# Patient Record
Sex: Female | Born: 2000 | Race: Black or African American | Hispanic: No | Marital: Single | Smoking: Former smoker
Health system: Southern US, Community
[De-identification: ages and names within clinical notes are randomized; demographics above are authoritative.]

## PROBLEM LIST (undated history)

## (undated) DIAGNOSIS — Z789 Other specified health status: Secondary | ICD-10-CM

## (undated) HISTORY — PX: NO PAST SURGERIES: SHX2092

---

## 2021-03-06 ENCOUNTER — Emergency Department (HOSPITAL_COMMUNITY)
Admission: EM | Admit: 2021-03-06 | Discharge: 2021-03-07 | Disposition: A | Discharge status: Home / Self Care | Payer: BC Managed Care – PPO | Attending: Emergency Medicine | Admitting: Emergency Medicine

## 2021-03-06 ENCOUNTER — Encounter (HOSPITAL_COMMUNITY): Payer: Self-pay | Admitting: Emergency Medicine

## 2021-03-06 ENCOUNTER — Other Ambulatory Visit: Payer: Self-pay

## 2021-03-06 DIAGNOSIS — R1084 Generalized abdominal pain: Secondary | ICD-10-CM

## 2021-03-06 DIAGNOSIS — O219 Vomiting of pregnancy, unspecified: Secondary | ICD-10-CM | POA: Diagnosis not present

## 2021-03-06 DIAGNOSIS — R109 Unspecified abdominal pain: Secondary | ICD-10-CM | POA: Insufficient documentation

## 2021-03-06 DIAGNOSIS — Z3A09 9 weeks gestation of pregnancy: Secondary | ICD-10-CM | POA: Diagnosis not present

## 2021-03-06 DIAGNOSIS — Z3490 Encounter for supervision of normal pregnancy, unspecified, unspecified trimester: Secondary | ICD-10-CM

## 2021-03-06 DIAGNOSIS — R112 Nausea with vomiting, unspecified: Secondary | ICD-10-CM

## 2021-03-06 MED ORDER — ONDANSETRON 4 MG PO TBDP
4.0000 mg | ORAL_TABLET | Freq: Once | ORAL | Status: AC
  Administered 2021-03-06: 4 mg via ORAL
  Filled 2021-03-06: qty 1

## 2021-03-06 NOTE — ED Provider Notes (Signed)
Emergency Medicine Provider Triage Evaluation Note  Maureen Atkinson , a 20 y.o. female  was evaluated in triage.  Pt complains of nausea and NBNB emesis that began earlier today. Pt states she began having diffuse abdominal pain around 7 PM today. NO diarrhea. No fevers or chills. No previous abdominal surgeries.   Review of Systems  Positive: + abdominal pain, nausea, vomiting Negative: - fevers, chills  Physical Exam  BP 124/70   Pulse 98   Temp 98.8 F (37.1 C)   Resp 18   Ht 5\' 2"  (1.575 m)   Wt 79.4 kg   SpO2 100%   BMI 32.01 kg/m  Gen:   Awake, no distress   Resp:  Normal effort  MSK:   Moves extremities without difficulty  Other:  Diffuse abdominal TTP  Medical Decision Making  Medically screening exam initiated at 10:56 PM.  Appropriate orders placed.  Maureen Atkinson was informed that the remainder of the evaluation will be completed by another provider, this initial triage assessment does not replace that evaluation, and the importance of remaining in the ED until their evaluation is complete.     Harvest Dark, PA-C 03/06/21 2257    03/08/21, MD 03/07/21 1349

## 2021-03-06 NOTE — ED Triage Notes (Signed)
Patient presents with upper abdominal pain since 1500 today. She has had 3 episodes of vomiting since. She tried to eat after her first episode but was unable to keep the food down.

## 2021-03-07 ENCOUNTER — Emergency Department (HOSPITAL_COMMUNITY): Payer: BC Managed Care – PPO

## 2021-03-07 LAB — CBC
HCT: 40.9 % (ref 36.0–46.0)
Hemoglobin: 13.6 g/dL (ref 12.0–15.0)
MCH: 30.6 pg (ref 26.0–34.0)
MCHC: 33.3 g/dL (ref 30.0–36.0)
MCV: 92.1 fL (ref 80.0–100.0)
Platelets: 433 10*3/uL — ABNORMAL HIGH (ref 150–400)
RBC: 4.44 MIL/uL (ref 3.87–5.11)
RDW: 13.2 % (ref 11.5–15.5)
WBC: 9.6 10*3/uL (ref 4.0–10.5)
nRBC: 0 % (ref 0.0–0.2)

## 2021-03-07 LAB — URINALYSIS, ROUTINE W REFLEX MICROSCOPIC
Bilirubin Urine: NEGATIVE
Glucose, UA: NEGATIVE mg/dL
Hgb urine dipstick: NEGATIVE
Ketones, ur: 40 mg/dL — AB
Leukocytes,Ua: NEGATIVE
Nitrite: NEGATIVE
Specific Gravity, Urine: >1.03 — ABNORMAL HIGH (ref 1.005–1.030)
pH: 6 (ref 5.0–8.0)

## 2021-03-07 LAB — COMPREHENSIVE METABOLIC PANEL
ALT: 16 U/L (ref 0–44)
AST: 22 U/L (ref 15–41)
Albumin: 4.5 g/dL (ref 3.5–5.0)
Alkaline Phosphatase: 59 U/L (ref 38–126)
Anion gap: 10 (ref 5–15)
BUN: 11 mg/dL (ref 6–20)
CO2: 22 mmol/L (ref 22–32)
Calcium: 9.6 mg/dL (ref 8.9–10.3)
Chloride: 102 mmol/L (ref 98–111)
Creatinine, Ser: 0.62 mg/dL (ref 0.44–1.00)
GFR, Estimated: >60 mL/min (ref >60)
Glucose, Bld: 123 mg/dL — ABNORMAL HIGH (ref 70–99)
Potassium: 3.9 mmol/L (ref 3.5–5.1)
Sodium: 134 mmol/L — ABNORMAL LOW (ref 135–145)
Total Bilirubin: 0.6 mg/dL (ref 0.3–1.2)
Total Protein: 8.8 g/dL — ABNORMAL HIGH (ref 6.5–8.1)

## 2021-03-07 LAB — LIPASE, BLOOD: Lipase: 32 U/L (ref 11–51)

## 2021-03-07 LAB — I-STAT BETA HCG BLOOD, ED (MC, WL, AP ONLY): I-stat hCG, quantitative: >2000 m[IU]/mL — ABNORMAL HIGH (ref <5)

## 2021-03-07 LAB — URINALYSIS, MICROSCOPIC (REFLEX): Bacteria, UA: NONE SEEN

## 2021-03-07 MED ORDER — SODIUM CHLORIDE 0.9 % IV BOLUS
1000.0000 mL | Freq: Once | INTRAVENOUS | Status: AC
  Administered 2021-03-07: 1000 mL via INTRAVENOUS

## 2021-03-07 MED ORDER — STERILE WATER FOR INJECTION IJ SOLN
INTRAMUSCULAR | Status: AC
  Filled 2021-03-07: qty 10

## 2021-03-07 MED ORDER — DOXYLAMINE-PYRIDOXINE 10-10 MG PO TBEC: 1.0000 | DELAYED_RELEASE_TABLET | Freq: Three times a day (TID) | ORAL | 0 refills | Status: DC | PRN

## 2021-03-07 MED ORDER — PANTOPRAZOLE SODIUM 40 MG IV SOLR
40.0000 mg | Freq: Once | INTRAVENOUS | Status: AC
  Administered 2021-03-07: 40 mg via INTRAVENOUS
  Filled 2021-03-07: qty 40

## 2021-03-07 MED ORDER — ONDANSETRON HCL 4 MG/2ML IJ SOLN
4.0000 mg | Freq: Once | INTRAMUSCULAR | Status: AC
  Administered 2021-03-07: 4 mg via INTRAVENOUS
  Filled 2021-03-07: qty 2

## 2021-03-07 NOTE — ED Provider Notes (Signed)
Emergency Department Provider Note   I have reviewed the triage vital signs and the nursing notes.   HISTORY  Chief Complaint Abdominal Pain, Emesis, and Nausea   HPI Maureen Atkinson is a 20 y.o. female past medical history reviewed below presents emergency department with abdominal cramping and nausea/vomiting.  Symptoms began today and progressively worse in the past 24 hours.  No fevers or chills.  No pain in the chest.  No hematemesis.  No diarrhea.  Denies sick contacts.  Denies alcohol or drug/marijuana use.  No similar symptoms in the past. Notes LMP was late July. No UTI symptoms, vaginal bleeding, or vaginal discharge.    History reviewed. No pertinent past medical history.  There are no problems to display for this patient.   History reviewed. No pertinent surgical history.  Allergies Patient has no known allergies.  History reviewed. No pertinent family history.  Social History Social History   Tobacco Use   Smoking status: Former    Types: Cigarettes   Smokeless tobacco: Never    Review of Systems  Constitutional: No fever/chills Eyes: No visual changes. ENT: No sore throat. Cardiovascular: Denies chest pain. Respiratory: Denies shortness of breath. Gastrointestinal: Positive abdominal pain. Positive nausea and vomiting.  No diarrhea.  No constipation. Genitourinary: Negative for dysuria. Musculoskeletal: Negative for back pain. Skin: Negative for rash. Neurological: Negative for headaches, focal weakness or numbness.  10-point ROS otherwise negative.  ____________________________________________   PHYSICAL EXAM:  VITAL SIGNS: ED Triage Vitals  Enc Vitals Group     BP 03/06/21 2243 124/70     Pulse Rate 03/06/21 2243 98     Resp 03/06/21 2243 18     Temp 03/06/21 2243 98.8 F (37.1 C)     Temp src --      SpO2 03/06/21 2243 100 %     Weight 03/06/21 2238 175 lb (79.4 kg)     Height 03/06/21 2238 5\' 2"  (1.575 m)   Constitutional:  Alert and oriented. Well appearing and in no acute distress. Eyes: Conjunctivae are normal.  Head: Atraumatic. Nose: No congestion/rhinnorhea. Mouth/Throat: Mucous membranes are moist.  Neck: No stridor.   Cardiovascular: Normal rate, regular rhythm. Good peripheral circulation. Grossly normal heart sounds.   Respiratory: Normal respiratory effort.  No retractions. Lungs CTAB. Gastrointestinal: Soft and non-tender diffusely. No peritonitis. Negative Murphy's sign. No distention.  Musculoskeletal: No lower extremity tenderness nor edema. No gross deformities of extremities. Neurologic:  Normal speech and language. No gross focal neurologic deficits are appreciated.  Skin:  Skin is warm, dry and intact. No rash noted.  ____________________________________________   LABS (all labs ordered are listed, but only abnormal results are displayed)  Labs Reviewed  COMPREHENSIVE METABOLIC PANEL - Abnormal; Notable for the following components:      Result Value   Sodium 134 (*)    Glucose, Bld 123 (*)    Total Protein 8.8 (*)    All other components within normal limits  CBC - Abnormal; Notable for the following components:   Platelets 433 (*)    All other components within normal limits  URINALYSIS, ROUTINE W REFLEX MICROSCOPIC - Abnormal; Notable for the following components:   APPearance HAZY (*)    Specific Gravity, Urine >1.030 (*)    Ketones, ur 40 (*)    Protein, ur TRACE (*)    All other components within normal limits  I-STAT BETA HCG BLOOD, ED ([facility], [facility], [facility] ONLY) - Abnormal; Notable for the following components:   I-stat  hCG, quantitative >2,000.0 (*)    All other components within normal limits  LIPASE, BLOOD  URINALYSIS, MICROSCOPIC (REFLEX)   ____________________________________________  RADIOLOGY  [place] reviewed.   ____________________________________________   PROCEDURES  Procedure(s) performed:   Procedures  None   ____________________________________________   INITIAL IMPRESSION / ASSESSMENT AND PLAN / ED COURSE  Pertinent labs & imaging results that were available during my care of the patient were reviewed by me and considered in my medical decision making (see chart for details).   Patient presents emergency department diffuse abdominal pain along with nausea and vomiting.  Her i-STAT beta-hCG coming back greater than 2000.  Ultrasound performed showing IUP.  There is a small area of subchorionic hemorrhage but patient is not experiencing vaginal bleeding, lower abdominal pain, or symptoms to suspect abruption or impending miscarriage.  Discussed these results with the patient.  She will establish care with OB/GYN    ____________________________________________  FINAL CLINICAL IMPRESSION(S) / ED DIAGNOSES  Final diagnoses:  Generalized abdominal pain  Non-intractable vomiting with nausea, unspecified vomiting type  Early stage of pregnancy     MEDICATIONS GIVEN DURING THIS VISIT:  Medications  ondansetron (ZOFRAN-ODT) disintegrating tablet 4 mg (4 mg Oral Given 03/06/21 2300)  sodium chloride 0.9 % bolus 1,000 mL (0 mLs Intravenous Stopped 03/07/21 0655)  pantoprazole (PROTONIX) injection 40 mg (40 mg Intravenous Given 03/07/21 0533)  ondansetron (ZOFRAN) injection 4 mg (4 mg Intravenous Given 03/07/21 0533)     NEW OUTPATIENT MEDICATIONS STARTED DURING THIS VISIT:  Discharge Medication List as of 03/07/2021  6:34 AM     START taking these medications   Details  Doxylamine-Pyridoxine 10-10 MG TBEC Take 1 tablet by mouth every 8 (eight) hours as needed (nausea and vomiting)., Starting Fri 03/07/2021, Print        Note:  This document was prepared using Dragon voice recognition software and may include unintentional dictation errors.  Alona Bene, MD, [facility] Emergency Medicine    Sandhya Denherder, Arlyss Repress, MD 03/10/21 [phone]

## 2021-03-07 NOTE — Discharge Instructions (Signed)
You were seen in the emergency room today with abdominal cramping and vomiting.  You are found to be pregnant, at approximately 9 weeks.  I called in some nausea medication that is helpful to take during pregnancy.  You will need to establish care with an OB/GYN.  Return to the emergency department for new or suddenly worsening symptoms such as vaginal bleeding, worsening pain, or trouble breathing.

## 2021-03-07 NOTE — ED Notes (Signed)
Ultrasound at bedside

## 2021-06-12 ENCOUNTER — Inpatient Hospital Stay (HOSPITAL_COMMUNITY)
Admission: AD | Admit: 2021-06-12 | Discharge: 2021-06-12 | Disposition: A | Discharge status: Home / Self Care | Payer: BC Managed Care – PPO | Attending: Obstetrics & Gynecology | Admitting: Obstetrics & Gynecology

## 2021-06-12 ENCOUNTER — Encounter (HOSPITAL_COMMUNITY): Payer: Self-pay | Admitting: *Deleted

## 2021-06-12 ENCOUNTER — Inpatient Hospital Stay (HOSPITAL_COMMUNITY): Payer: BC Managed Care – PPO

## 2021-06-12 DIAGNOSIS — O418X10 Other specified disorders of amniotic fluid and membranes, first trimester, not applicable or unspecified: Secondary | ICD-10-CM | POA: Diagnosis not present

## 2021-06-12 DIAGNOSIS — Z3A01 Less than 8 weeks gestation of pregnancy: Secondary | ICD-10-CM | POA: Diagnosis not present

## 2021-06-12 DIAGNOSIS — R45851 Suicidal ideations: Secondary | ICD-10-CM

## 2021-06-12 DIAGNOSIS — O208 Other hemorrhage in early pregnancy: Secondary | ICD-10-CM | POA: Insufficient documentation

## 2021-06-12 DIAGNOSIS — Z3491 Encounter for supervision of normal pregnancy, unspecified, first trimester: Secondary | ICD-10-CM

## 2021-06-12 DIAGNOSIS — O99341 Other mental disorders complicating pregnancy, first trimester: Secondary | ICD-10-CM | POA: Diagnosis not present

## 2021-06-12 DIAGNOSIS — O468X1 Other antepartum hemorrhage, first trimester: Secondary | ICD-10-CM | POA: Diagnosis not present

## 2021-06-12 DIAGNOSIS — Z6790 Unspecified blood type, Rh positive: Secondary | ICD-10-CM

## 2021-06-12 DIAGNOSIS — O4690 Antepartum hemorrhage, unspecified, unspecified trimester: Secondary | ICD-10-CM

## 2021-06-12 DIAGNOSIS — O209 Hemorrhage in early pregnancy, unspecified: Secondary | ICD-10-CM | POA: Diagnosis present

## 2021-06-12 HISTORY — DX: Other specified health status: Z78.9

## 2021-06-12 LAB — CBC
HCT: 36.7 % (ref 36.0–46.0)
Hemoglobin: 11.7 g/dL — ABNORMAL LOW (ref 12.0–15.0)
MCH: 29 pg (ref 26.0–34.0)
MCHC: 31.9 g/dL (ref 30.0–36.0)
MCV: 90.8 fL (ref 80.0–100.0)
Platelets: 350 10*3/uL (ref 150–400)
RBC: 4.04 MIL/uL (ref 3.87–5.11)
RDW: 13.8 % (ref 11.5–15.5)
WBC: 5.8 10*3/uL (ref 4.0–10.5)
nRBC: 0 % (ref 0.0–0.2)

## 2021-06-12 LAB — WET PREP, GENITAL
Clue Cells Wet Prep HPF POC: NONE SEEN
Sperm: NONE SEEN
Trich, Wet Prep: NONE SEEN
WBC, Wet Prep HPF POC: <10 (ref <10)
Yeast Wet Prep HPF POC: NONE SEEN

## 2021-06-12 LAB — ABO/RH: ABO/RH(D): O POS

## 2021-06-12 LAB — HCG, QUANTITATIVE, PREGNANCY: hCG, Beta Chain, Quant, S: 81234 m[IU]/mL — ABNORMAL HIGH (ref <5)

## 2021-06-12 LAB — POCT PREGNANCY, URINE: Preg Test, Ur: POSITIVE — AB

## 2021-06-12 NOTE — MAU Provider Note (Signed)
History     CSN: [id number]  Arrival date and time: 06/12/21 1207   Event Date/Time   First Provider Initiated Contact with Patient 06/12/21 1350      Chief Complaint  Patient presents with   Vaginal Bleeding   20 y.o. [id number] @[redacted]w[redacted]d  by LMP presenting with VB. Reports waking up around 10 am in blood. Clothes were soaked. Bleeding has continued and has required pads. Denies pain or cramping. Upon nursing intake she admits to thoughts of harming herself. States this started before she found out she was pregnant and has worsened since. She denies having a plan. Reports hx of depression in past, has never sought care.   OB History     Gravida  3   Para      Term      Preterm      AB  2   Living         SAB      IAB  2   Ectopic      Multiple      Live Births              Past Medical History:  Diagnosis Date   Medical history non-contributory     Past Surgical History:  Procedure Laterality Date   NO PAST SURGERIES      History reviewed. No pertinent family history.  Social History   Tobacco Use   Smoking status: Former    Types: Cigarettes   Smokeless tobacco: Never  Substance Use Topics   Alcohol use: Not Currently   Drug use: Never    Allergies: No Known Allergies  Medications Prior to Admission  Medication Sig Dispense Refill Last Dose   Doxylamine-Pyridoxine 10-10 MG TBEC Take 1 tablet by mouth every 8 (eight) hours as needed (nausea and vomiting). 30 tablet 0     Review of Systems  Gastrointestinal:  Negative for abdominal pain.  Genitourinary:  Positive for vaginal bleeding.  Psychiatric/Behavioral:  Positive for suicidal ideas.   Physical Exam   Blood pressure 121/70, pulse 85, temperature 98.7 F (37.1 C), resp. rate 18, height 5\' 2"  (1.575 m), weight 78.5 kg, last menstrual period 04/29/2021.  Physical Exam Vitals and nursing note reviewed. Exam conducted with a chaperone present.  Constitutional:      General: She is not  in acute distress.    Appearance: Normal appearance.  HENT:     Head: Normocephalic and atraumatic.  Cardiovascular:     Rate and Rhythm: Normal rate.  Pulmonary:     Effort: Pulmonary effort is normal. No respiratory distress.  Abdominal:     General: There is no distension.     Palpations: Abdomen is soft. There is no mass.     Tenderness: There is no abdominal tenderness. There is no guarding or rebound.     Hernia: No hernia is present.  Genitourinary:    Comments: External: no lesions or erythema Vagina: rugated, pink, moist, small amt bloody discharge, cleared with 1 fox swab Cervix closed   Musculoskeletal:        General: Normal range of motion.     Cervical back: Normal range of motion.  Skin:    General: Skin is warm and dry.  Neurological:     General: No focal deficit present.     Mental Status: She is alert and oriented to person, place, and time.  Psychiatric:        Mood and Affect: Mood normal.  Behavior: Behavior normal.   Results for orders placed or performed during the hospital encounter of 06/12/21 (from the past 24 hour(s))  Pregnancy, urine POC     Status: Abnormal   Collection Time: 06/12/21 12:27 PM  Result Value Ref Range   Preg Test, Ur POSITIVE (A) NEGATIVE  Wet prep, genital     Status: None   Collection Time: 06/12/21 12:30 PM   Specimen: Vaginal  Result Value Ref Range   Yeast Wet Prep HPF POC NONE SEEN NONE SEEN   Trich, Wet Prep NONE SEEN NONE SEEN   Clue Cells Wet Prep HPF POC NONE SEEN NONE SEEN   WBC, Wet Prep HPF POC <10 <10   Sperm NONE SEEN   ABO/Rh     Status: None   Collection Time: 06/12/21  1:05 PM  Result Value Ref Range   ABO/RH(D) O POS    No rh immune globuloin      NOT A RH IMMUNE GLOBULIN CANDIDATE, PT RH POSITIVE Performed at [facility], [address] [place], [place] [address]   hCG, quantitative, pregnancy     Status: Abnormal   Collection Time: 06/12/21  1:05 PM  Result Value Ref Range   hCG,  Beta Chain, Quant, S 81,234 (H) <5 mIU/mL  CBC     Status: Abnormal   Collection Time: 06/12/21  1:05 PM  Result Value Ref Range   WBC 5.8 4.0 - 10.5 K/uL   RBC 4.04 3.87 - 5.11 MIL/uL   Hemoglobin 11.7 (L) 12.0 - 15.0 g/dL   HCT 03.5 24.8 - 18.5 %   MCV 90.8 80.0 - 100.0 fL   MCH 29.0 26.0 - 34.0 pg   MCHC 31.9 30.0 - 36.0 g/dL   RDW 90.9 31.1 - 21.6 %   Platelets 350 150 - 400 K/uL   nRBC 0.0 0.0 - 0.2 %   US OB LESS THAN 14 WEEKS WITH OB TRANSVAGINAL  Result Date: 06/12/2021 CLINICAL DATA:  Vaginal bleeding EXAM: OBSTETRIC <14 WK [place] AND TRANSVAGINAL OB US TECHNIQUE: Both transabdominal and transvaginal ultrasound examinations were performed for complete evaluation of the gestation as well as the maternal uterus, adnexal regions, and pelvic cul-de-sac. Transvaginal technique was performed to assess early pregnancy. COMPARISON:  None. FINDINGS: Intrauterine gestational sac: Single Yolk sac:  Visualized. Embryo:  Visualized. Cardiac Activity: Visualized. Heart Rate: 124 bpm CRL:  5.6 mm   6 w   2 d                  [place] EDC: 02/03/2022 Subchorionic hemorrhage: Large subchorionic hemorrhage measuring 5.8 x 2.2 x 1.4 cm. Maternal uterus/adnexae: Normal appearance of the bilateral ovaries IMPRESSION: 1. Single viable intrauterine pregnancy with an estimated gestational age of [redacted] weeks 2 days based on crown-rump length and ultrasound E CT of 02/03/2022. 2. Large subchorionic hemorrhage. Electronically Signed   By: Allegra Lai M.D.   On: 06/12/2021 14:26    MAU Course  Procedures  MDM Labs and [place] ordered and reviewed. Consult telepsych. Viable IUP with large SCH, discussed results with pt. Psych consult completed by Nira Conn, FNP (see note), recommends outpt referral, resources provided in AVS Stable for discharge home  Assessment and Plan   1. [redacted] weeks gestation of pregnancy   2. Vaginal bleeding in pregnancy   3. Normal intrauterine pregnancy on prenatal ultrasound in first trimester    4. Subchorionic hematoma in first trimester, single or unspecified fetus   5. Blood type, Rh  positive   6. Suicidal thoughts    Discharge home Follow up at [facility] as scheduled SAB precautions Pelvic rest  Allergies as of 06/12/2021   No Known Allergies      Medication List     TAKE these medications    Doxylamine-Pyridoxine 10-10 MG Tbec Take 1 tablet by mouth every 8 (eight) hours as needed (nausea and vomiting).       Donette Larry, CNM 06/12/2021, 2:34 PM

## 2021-06-12 NOTE — MAU Note (Signed)
Call to [facility] to notify of consult.

## 2021-06-12 NOTE — BH Assessment (Signed)
Comprehensive Clinical Assessment (CCA) Note  06/12/2021 Maureen Atkinson [id number]  DISPOSITION: Gave clinical report to Lindon Romp, FNP who determined Pt does not meet criteria for inpatient psychiatric treatment. Recommendation is for Pt to be given referrals for outpatient mental health treatment. Pt provided information for [facility], and [facility]. AVS will also include 24-hour crisis number. Notified Lorelei Pont, CNM and Glenice Bow, RN of recommendation via secure message.  The patient demonstrates the following risk factors for suicide: Chronic risk factors for suicide include: N/A. Acute risk factors for suicide include: N/A. Protective factors for this patient include: positive social support, responsibility to others (children, family), coping skills, hope for the future, and religious beliefs against suicide. Considering these factors, the overall suicide risk at this point appears to be low. Patient is appropriate for outpatient follow up.  Flowsheet Row Admission (Current) from 06/12/2021 in [facility] ED from 03/06/2021 in [facility] DEPT  C-SSRS RISK CATEGORY Low Risk No Risk      Pt is a 20 year old single female who presents unaccompanied to [facility] reporting vaginal bleeding and depressive symptoms. Pt describes experiencing symptoms of depression and anxiety prior to becoming pregnant six weeks ago. She says symptoms have increased since pregnancy. Pt acknowledges symptoms including crying spells, social withdrawal, loss of interest in usual pleasures, fatigue, irritability, decreased concentration, and feelings of worthlessness and hopelessness. She describes recurring suicidal thoughts "not often" that occur when she feels extremely stressed. She denies plan or intent to harm herself, adding that she does  not want to die but wants to escape her situation. She denies current suicidal ideation. Protective factors against suicide include good social support, future orientation, no access to firearms, religious convictions and no prior attempts. Pt denies any history of intentional self-injurious behaviors. Pt denies current homicidal ideation or history of violence. Pt denies any history of auditory or visual hallucinations. Pt describes drinking alcohol socially and denies alcohol use has been a problem. She says she did smoke marijuana regularly but stopped three weeks ago.  Pt identifies several stressors. She states financial concerns are her primary stressor. She states she is in her junior year at [employer] and works when she is on breaks. She recently relocated to [facility] and lives with a roommate in student housing. She says this his her third pregnancy and she has no children. She identifies her best friend, who lives across the street from Pt, as her primary support. She also reports her uncle is supportive. She denies history of abuse or trauma. She denies legal problems. She denies access to firearms. Pt says she has brief outpatient therapy at age 1 due to family conflicts. She denies any history of inpatient psychiatric treatment.  Pt is casually dressed and well-groomed, alert and oriented x4. Pt speaks in a clear tone, at moderate volume and normal pace. Motor behavior appears normal. Eye contact is good. Pt's mood is mildly depressed and affect is congruent with mood. Thought process is coherent and relevant. There is no indication Pt is currently responding to internal stimuli or experiencing delusional thought content. Pt was calm and cooperative throughout assessment. She says she would like to participate in outpatient therapy to address depressive symptoms. She contracts for safety and says her best friend is available.   Chief Complaint:  Chief Complaint  Patient  presents with   Vaginal Bleeding   Visit Diagnosis: F33.1 Major  depressive disorder, Recurrent episode, Moderate   CCA Screening, Triage and Referral (STR)  Patient Reported Information How did you hear about [place]? Self  Referral name: No data recorded Referral phone number: No data recorded  Whom do you see for routine medical problems? No data recorded Practice/Facility Name: No data recorded Practice/Facility Phone Number: No data recorded Name of Contact: No data recorded Contact Number: No data recorded Contact Fax Number: No data recorded Prescriber Name: No data recorded Prescriber Address (if known): No data recorded  What Is the Reason for Your Visit/Call Today? Pt presented for vaginal bleeding. Pt reports she has experienced symptoms of depression and anxiety for several months which have increased since pregnacy. She reports recurring suicidal ideation when severely stressed with no plan or intent to harm herself.  How Long Has This Been Causing You Problems? 1-6 months  What Do You Feel Would Help You the Most Today? Treatment for Depression or other mood problem   Have You Recently Been in Any Inpatient Treatment (Hospital/Detox/Crisis Center/28-Day Program)? No data recorded Name/Location of Program/Hospital:No data recorded How Long Were You There? No data recorded When Were You Discharged? No data recorded  Have You Ever Received Services From [facility] Before? No data recorded Who Do You See at [facility]? No data recorded  Have You Recently Had Any Thoughts About Hurting Yourself? Yes  Are You Planning to Commit Suicide/Harm Yourself At This time? No   Have you Recently Had Thoughts About Hurting Someone Karolee Ohs? No  Explanation: No data recorded  Have You Used Any Alcohol or Drugs in the Past 24 Hours? No  How Long Ago Did You Use Drugs or Alcohol? No data recorded What Did You Use and How Much? No data recorded  Do You Currently Have a  Therapist/Psychiatrist? No  Name of Therapist/Psychiatrist: No data recorded  Have You Been Recently Discharged From Any Office Practice or Programs? No  Explanation of Discharge From Practice/Program: No data recorded    CCA Screening Triage Referral Assessment Type of Contact: Tele-Assessment  Is this Initial or Reassessment? Initial Assessment  Date Telepsych consult ordered in [facility]:  06/12/21  Time Telepsych consult ordered in [facility]:  [id number]   Patient Reported Information Reviewed? No data recorded Patient Left Without Being Seen? No data recorded Reason for Not Completing Assessment: No data recorded  Collateral Involvement: None   Does Patient Have a Court Appointed Legal Guardian? No data recorded Name and Contact of Legal Guardian: No data recorded If Minor and Not Living with Parent(s), Who has Custody? NA  Is CPS involved or ever been involved? Never  Is APS involved or ever been involved? Never   Patient Determined To Be At Risk for Harm To Self or Others Based on Review of Patient Reported Information or Presenting Complaint? No  Method: No data recorded Availability of Means: No data recorded Intent: No data recorded Notification Required: No data recorded Additional Information for Danger to Others Potential: No data recorded Additional Comments for Danger to Others Potential: No data recorded Are There Guns or Other Weapons in Your Home? No data recorded Types of Guns/Weapons: No data recorded Are These Weapons Safely Secured?                            No data recorded Who Could Verify You Are Able To Have These Secured: No data recorded Do You Have any Outstanding Charges, Pending Court Dates, Parole/Probation? No data  recorded Contacted To Inform of Risk of Harm To Self or Others: Other: Comment (NA)   Location of Assessment: [facility]   Does Patient Present under Involuntary Commitment? No data recorded IVC Papers Initial File Date: No data  recorded  [place] of Residence: No data recorded  Patient Currently Receiving the Following Services: Not Receiving Services   Determination of Need: Routine (7 days)   Options For Referral: Outpatient Therapy; Medication Management     CCA Biopsychosocial Intake/Chief Complaint:  No data recorded Current Symptoms/Problems: No data recorded  Patient Reported Schizophrenia/Schizoaffective Diagnosis in Past: No   Strengths: Pt is articulate and motivated for treatment  Preferences: No data recorded Abilities: No data recorded  Type of Services Patient Feels are Needed: No data recorded  Initial Clinical Notes/Concerns: No data recorded  Mental Health Symptoms Depression:   Change in energy/activity; Difficulty Concentrating; Fatigue; Hopelessness; Irritability; Tearfulness; Worthlessness   Duration of Depressive symptoms:  Greater than two weeks   Mania:   None   Anxiety:    Difficulty concentrating; Fatigue; Irritability; Tension; Worrying   Psychosis:   None   Duration of Psychotic symptoms: No data recorded  Trauma:   None   Obsessions:   None   Compulsions:   None   Inattention:   N/A   Hyperactivity/Impulsivity:   N/A   Oppositional/Defiant Behaviors:   N/A   Emotional Irregularity:   None   Other Mood/Personality Symptoms:   NA    Mental Status Exam Appearance and self-care  Stature:   Average   Weight:   Average weight   Clothing:   Casual   Grooming:   Normal   Cosmetic use:   Age appropriate   Posture/gait:   Normal   Motor activity:   Not Remarkable   Sensorium  Attention:   Normal   Concentration:   Normal   Orientation:   X5   Recall/memory:   Normal   Affect and Mood  Affect:   Appropriate   Mood:   Anxious; Depressed   Relating  Eye contact:   Normal   Facial expression:   Responsive   Attitude toward examiner:   Cooperative   Thought and Language  Speech flow:  Clear and  Coherent   Thought content:   Appropriate to Mood and Circumstances   Preoccupation:   None   Hallucinations:   None   Organization:  No data recorded  [employer] of Knowledge:   Average   Intelligence:   Average   Abstraction:   Normal   Judgement:   Normal   Reality Testing:   Realistic   Insight:   Good   Decision Making:   Normal   Social Functioning  Social Maturity:   Responsible   Social Judgement:   Normal   Stress  Stressors:   Museum/gallery curator; School; Transitions; Other (Comment) (Pregancy)   Coping Ability:   Exhausted   Skill Deficits:   None   Supports:   Family; Friends/Service system     Religion: Religion/Spirituality Are You A Religious Person?: Yes What is Your Religious Affiliation?: Christian How Might This Affect Treatment?: NA  Leisure/Recreation: Leisure / Recreation Do You Have Hobbies?: Yes Leisure and Hobbies: Going to parties, skating, bowling  Exercise/Diet: Exercise/Diet Do You Exercise?: No Have You Gained or Lost A Significant Amount of Weight in the Past Six Months?: No Do You Follow a Special Diet?: No Do You Have Any Trouble Sleeping?: Yes Explanation of Sleeping Difficulties: Pt describes  staying up late and sleeping late.   CCA Employment/Education Employment/Work Situation: Employment / Work Situation Employment Situation: Radio broadcast assistant Job has Been Impacted by Current Illness: No Has Patient ever Been in the [employer]?: No  Education: Education Is Patient Currently Attending School?: Yes School Currently Attending: [employer] Did [employer]?: Yes What Type of College Degree Do you Have?: Pt in junior year Did You Have An Individualized Education Program (IIEP): No Did You Have Any Difficulty At [employer]?: No Patient's Education Has Been Impacted by Current Illness: No   CCA Family/Childhood History Family and Relationship History: Family  history Marital status: Single Does patient have children?: No  Childhood History:  Childhood History By whom was/is the patient raised?: Mother/father and step-parent, Grandparents Did patient suffer any verbal/emotional/physical/sexual abuse as a child?: No Did patient suffer from severe childhood neglect?: No Has patient ever been sexually abused/assaulted/raped as an adolescent or adult?: No Was the patient ever a victim of a crime or a disaster?: No Witnessed domestic violence?: No Has patient been affected by domestic violence as an adult?: No  Child/Adolescent Assessment:     CCA Substance Use Alcohol/Drug Use: Alcohol / Drug Use Pain Medications: Denies abuse Prescriptions: Denies abuse Over the Counter: Denies abuse History of alcohol / drug use?: Yes Longest period of sobriety (when/how long): 3 weeks currently Negative Consequences of Use:  (Pt denies) Withdrawal Symptoms:  (Pt denies) Substance #1 Name of Substance 1: Marijuana 1 - Age of First Use: Adolescent 1 - Amount (size/oz): Varies 1 - Frequency: Reports she used to smoke once per week 1 - Duration: Stopped 3 weeks ago 1 - Last Use / Amount: 3 weeks ago 1 - Method of Aquiring: unknown 1- Route of Use: Smoking                       ASAM's:  Six Dimensions of Multidimensional Assessment  Dimension 1:  Acute Intoxication and/or Withdrawal Potential:      Dimension 2:  Biomedical Conditions and Complications:      Dimension 3:  Emotional, Behavioral, or Cognitive Conditions and Complications:     Dimension 4:  Readiness to Change:     Dimension 5:  Relapse, Continued use, or Continued Problem Potential:     Dimension 6:  Recovery/Living Environment:     ASAM Severity Score:    ASAM Recommended Level of Treatment:     Substance use Disorder (SUD)    Recommendations for Services/Supports/Treatments:    DSM5 Diagnoses: There are no problems to display for this patient.   Patient  Centered Plan: Patient is on the following Treatment Plan(s):  Depression   Referrals to Alternative Service(s): Referred to Alternative Service(s):   Place:   Date:   Time:    Referred to Alternative Service(s):   Place:   Date:   Time:    Referred to Alternative Service(s):   Place:   Date:   Time:    Referred to Alternative Service(s):   Place:   Date:   Time:     Evelena Peat, [facility]

## 2021-06-12 NOTE — MAU Note (Signed)
Pt had positive HPT lst week. Woke up today in "pool of blood" denies any pain or cramping at this time. Continues to bleed. Denies any recent intercourse.

## 2021-06-12 NOTE — MAU Note (Signed)
Pt given sandwich tray 

## 2021-06-12 NOTE — MAU Note (Signed)
Pt resting quietly. Aware of reason for delay

## 2021-06-13 LAB — GC/CHLAMYDIA PROBE AMP (~~LOC~~) NOT AT ARMC
Chlamydia: NEGATIVE
Comment: NEGATIVE
Comment: NORMAL
Neisseria Gonorrhea: NEGATIVE

## 2023-01-18 IMAGING — US US OB COMP LESS 14 WK
1 series · 15 of 28 positions shown · non-contrast
Comparison: None.

CLINICAL DATA: Abdominal pain and pregnancy

EXAM:
OBSTETRIC <14 WK ULTRASOUND
TECHNIQUE: Transabdominal ultrasound was performed for evaluation of the
gestation as well as the maternal uterus and adnexal regions.

[Series 1: us ob comp less 14 wks mc & wl · 15 of 39 slices shown]
[im 1/39]
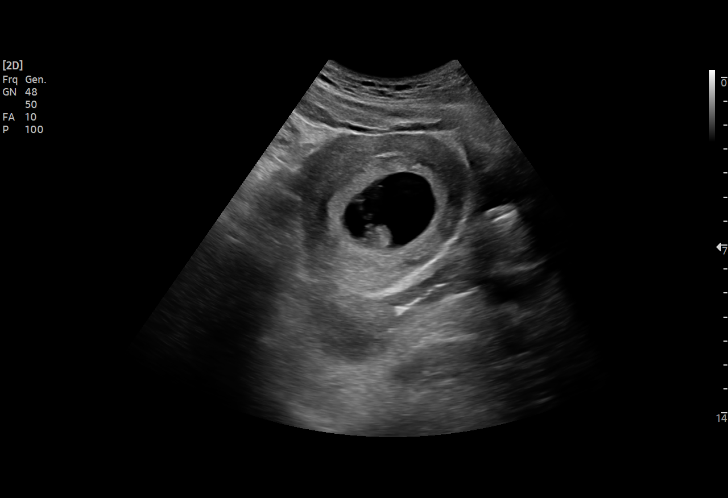
[im 3/39]
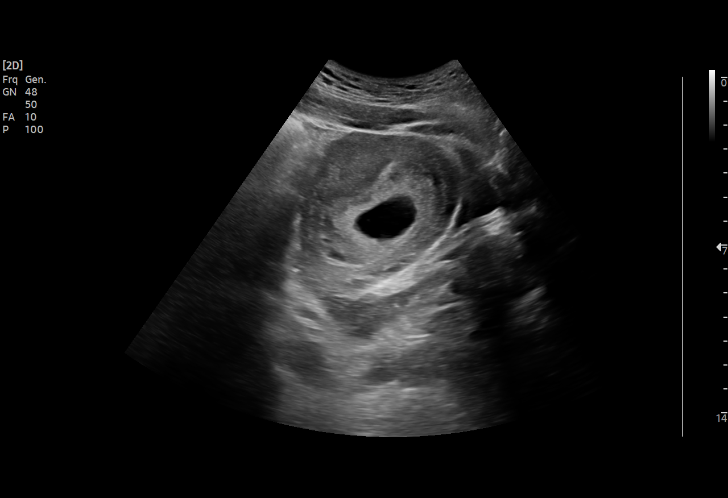
[im 6/39]
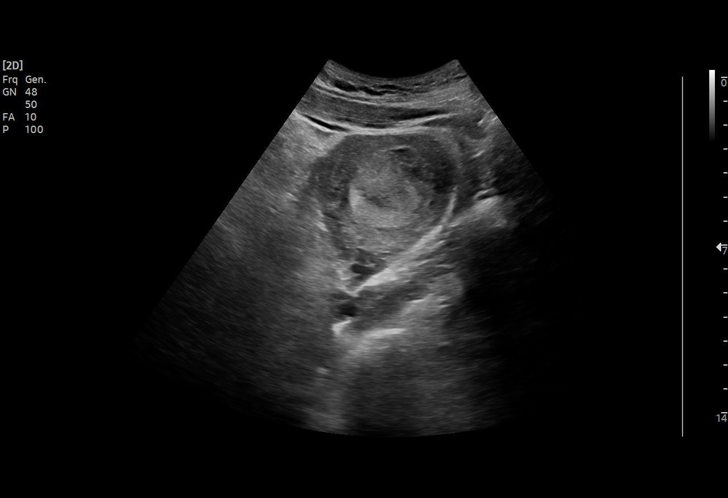
[im 9/39]
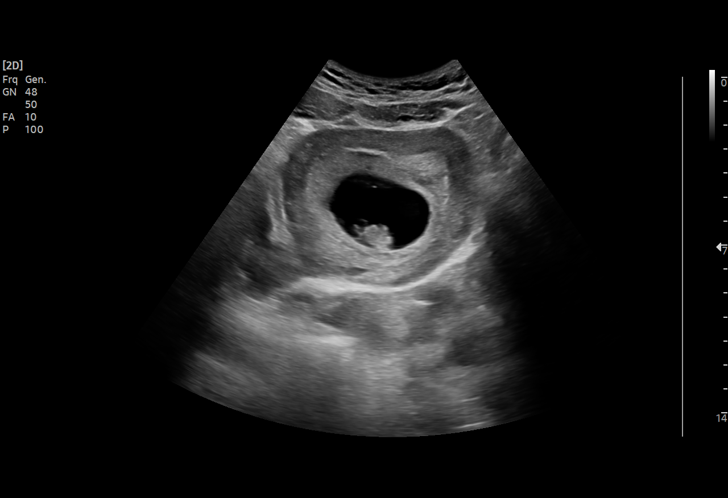
[im 12/39]
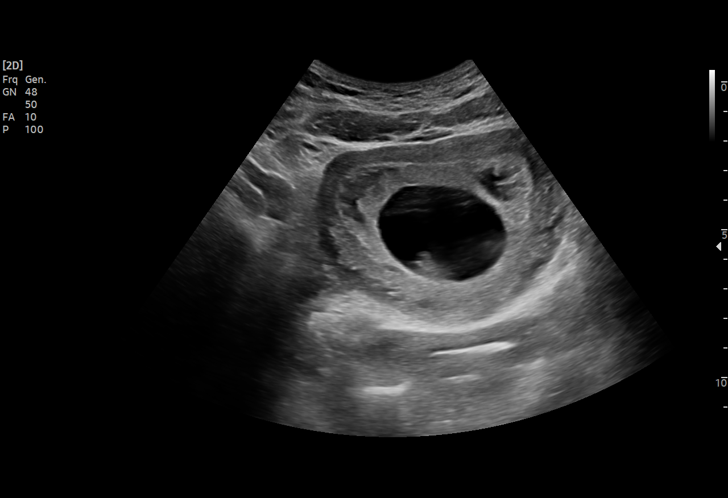
[im 15/39]
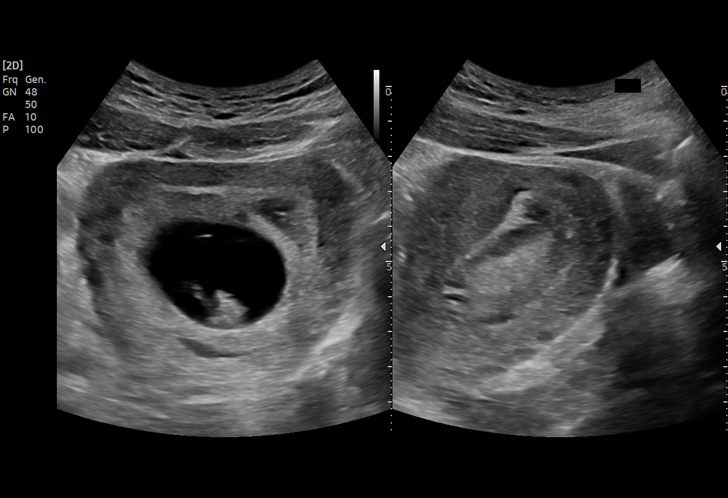
[im 17/39]
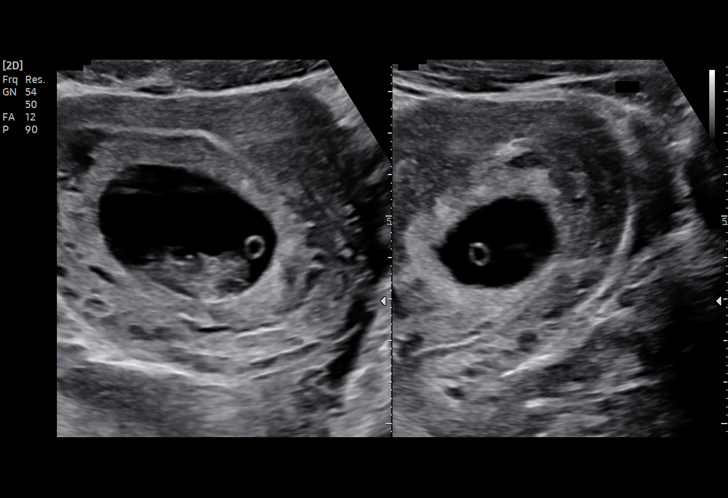
[im 20/39]
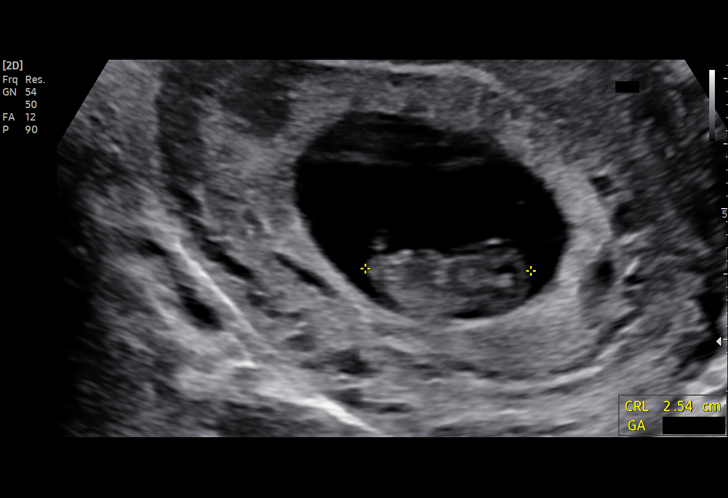
[im 22/39]
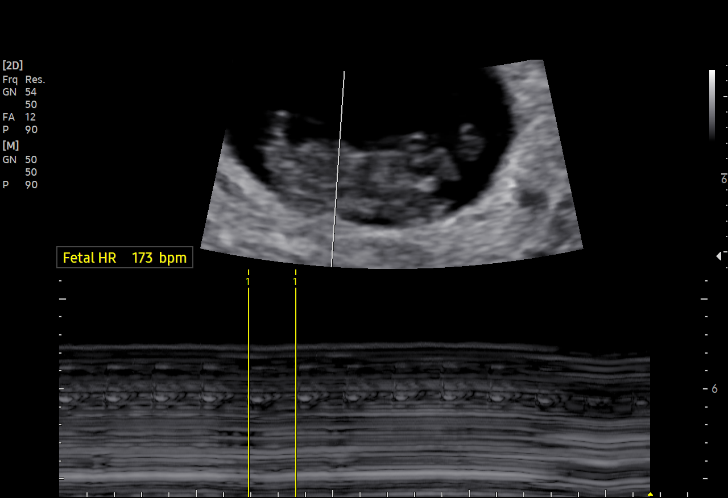
[im 24/39]
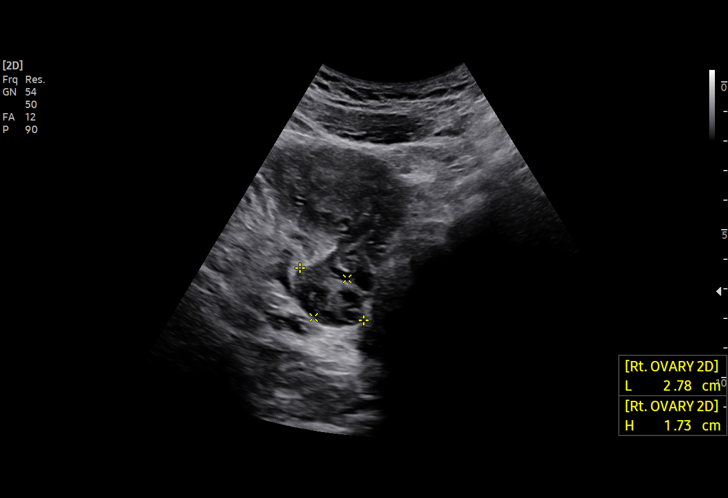
[im 27/39]
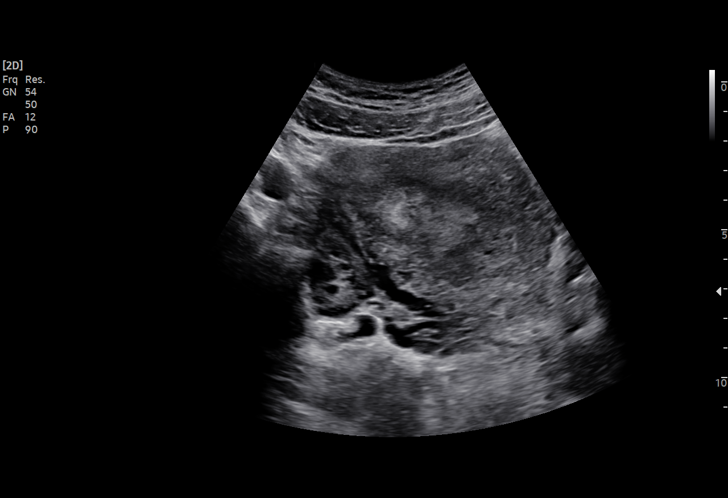
[im 30/39]
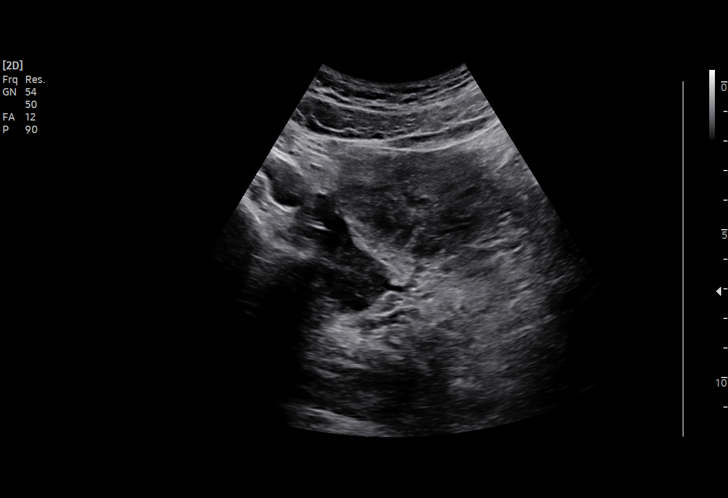
[im 33/39]
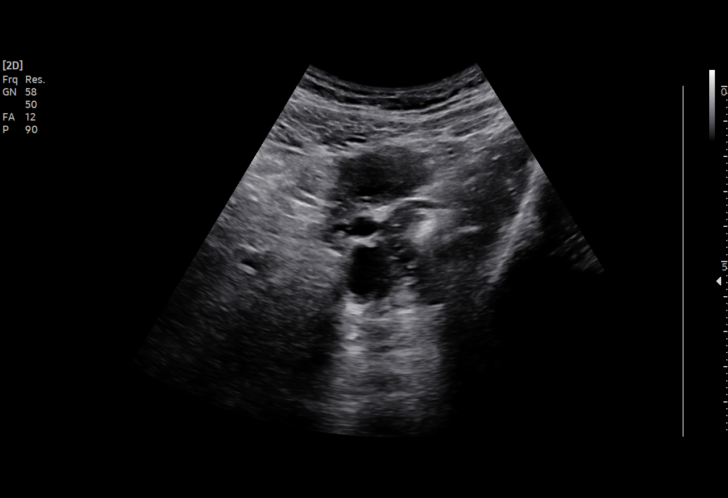
[im 36/39]
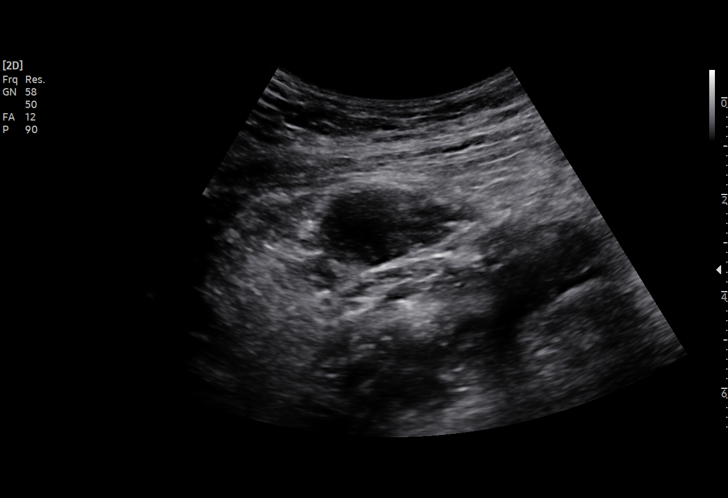
[im 39/39]
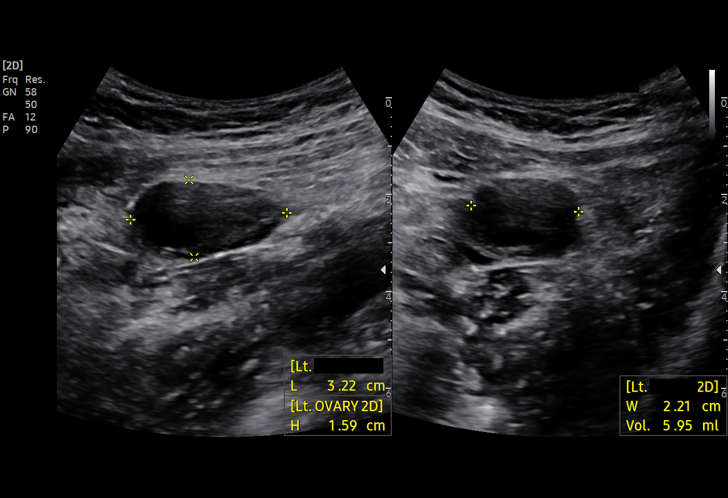

[15 of 28 positions shown; findings below may reference images not displayed]

FINDINGS: Intrauterine gestational sac: Single

Yolk sac:  Visualized

Embryo:  Visualized

Cardiac Activity: Visualized

Heart Rate: 173 bpm

CRL:   24.9 mm   9 w 1 d                  US EDC: 10/09/2021

Subchorionic hemorrhage: Too small hypoechoic subchorionic fluid
collections are identified compatible with hematoma. These measure:

-2.0 x 1.4 x 1.0 cm (volume = 1.5 cm^3).

-2.7 x 1.1 x 1.3 cm (volume = 2 cm^3)

Maternal uterus/adnexae:

Right ovary: Normal

Left ovary: Normal containing corpus luteum

Other :None

Free fluid:  None
IMPRESSION: 1. Single living intrauterine gestation with an estimated
gestational age of 9 weeks and 1 day. The gestational age by last
menstrual period is 9 weeks and 0 days.
2. There are 2 small subchorionic hematomas with maximum volume of 2
cc.

## 2023-02-04 ENCOUNTER — Other Ambulatory Visit: Payer: Self-pay | Admitting: Nurse Practitioner

## 2023-02-04 ENCOUNTER — Ambulatory Visit
Admission: RE | Admit: 2023-02-04 | Discharge: 2023-02-04 | Disposition: A | Discharge status: Home / Self Care | Payer: BC Managed Care – PPO | Source: Ambulatory Visit | Attending: Nurse Practitioner | Admitting: Nurse Practitioner

## 2023-02-04 ENCOUNTER — Ambulatory Visit (INDEPENDENT_AMBULATORY_CARE_PROVIDER_SITE_OTHER): Payer: BC Managed Care – PPO | Admitting: Nurse Practitioner

## 2023-02-04 VITALS — BP 110/59 | HR 87 | Temp 97.1°F | Ht 62.0 in | Wt 182.0 lb

## 2023-02-04 DIAGNOSIS — G8929 Other chronic pain: Secondary | ICD-10-CM | POA: Insufficient documentation

## 2023-02-04 DIAGNOSIS — Z0000 Encounter for general adult medical examination without abnormal findings: Secondary | ICD-10-CM | POA: Diagnosis not present

## 2023-02-04 DIAGNOSIS — M25511 Pain in right shoulder: Secondary | ICD-10-CM

## 2023-02-04 DIAGNOSIS — L299 Pruritus, unspecified: Secondary | ICD-10-CM

## 2023-02-04 DIAGNOSIS — E559 Vitamin D deficiency, unspecified: Secondary | ICD-10-CM

## 2023-02-04 DIAGNOSIS — M419 Scoliosis, unspecified: Secondary | ICD-10-CM

## 2023-02-04 DIAGNOSIS — M546 Pain in thoracic spine: Secondary | ICD-10-CM | POA: Diagnosis not present

## 2023-02-04 DIAGNOSIS — Z13220 Encounter for screening for lipoid disorders: Secondary | ICD-10-CM | POA: Diagnosis not present

## 2023-02-04 MED ORDER — HYDROXYZINE HCL 10 MG PO TABS: 10.0000 mg | ORAL_TABLET | Freq: Three times a day (TID) | ORAL | 0 refills | Status: AC | PRN

## 2023-02-04 MED ORDER — MELOXICAM 7.5 MG PO TABS: 7.5000 mg | ORAL_TABLET | Freq: Every day | ORAL | 0 refills | Status: AC

## 2023-02-04 NOTE — Patient Instructions (Addendum)
1. Chronic bilateral thoracic back pain  - DG Thoracic Spine 2 View - meloxicam (MOBIC) 7.5 MG tablet; Take 1 tablet (7.5 mg total) by mouth daily.  Dispense: 30 tablet; Refill: 0  2. Chronic right shoulder pain  - DG Shoulder Right - meloxicam (MOBIC) 7.5 MG tablet; Take 1 tablet (7.5 mg total) by mouth daily.  Dispense: 30 tablet; Refill: 0  3. Lipid screening  - Lipid Panel  4. Routine adult health maintenance  - CBC - Comprehensive metabolic panel  5. Vitamin D deficiency  - Vitamin D, 25-hydroxy  6. Itching  - hydrOXYzine (ATARAX) 10 MG tablet; Take 1 tablet (10 mg total) by mouth 3 (three) times daily as needed.  Dispense: 30 tablet; Refill: 0 - Ambulatory referral to Dermatology  Follow up:  Follow up in 3 months

## 2023-02-04 NOTE — Progress Notes (Signed)
@Patient  ID: Harvest Dark, female    DOB: 02-Sep-2000, 22 y.o.   MRN: [id number]  Chief Complaint  Patient presents with   Establish Care    Fasting     Referring provider: No ref. provider found   HPI  22 year old female with no significant health history.  Patient presents today to establish care.  She states that she has been having back pain for several months now.  She states that she thinks this is because of poor posture and that she also has large breasts.  She thinks the weight is making her back hurt.  She also complains of right shoulder pain.  She states that this is intermittent.  She does have full range of motion to her right arm and shoulder.  We will order x-rays today.  We will trial Mobic as needed.  Patient also complains today of itching.  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  She would like a referral to dermatology.  Will place referral today and trial Atarax. Denies f/c/s, n/v/d, hemoptysis, PND, leg swelling Denies chest pain or edema      No Known Allergies   There is no immunization history on file for this patient.  Past Medical History:  Diagnosis Date   Medical history non-contributory     Tobacco History: Social History   Tobacco Use  Smoking Status Former   Types: Cigarettes  Smokeless Tobacco Never   Counseling given: Not Answered   Outpatient Encounter Medications as of 02/04/2023  Medication Sig   hydrOXYzine (ATARAX) 10 MG tablet Take 1 tablet (10 mg total) by mouth 3 (three) times daily as needed.   meloxicam (MOBIC) 7.5 MG tablet Take 1 tablet (7.5 mg total) by mouth daily.   [DISCONTINUED] Doxylamine-Pyridoxine 10-10 MG TBEC Take 1 tablet by mouth every 8 (eight) hours as needed (nausea and vomiting). (Patient not taking: Reported on 02/04/2023)   No facility-administered encounter medications on file as of 02/04/2023.     Review of Systems  Review of Systems  Constitutional: Negative.   HENT:  Negative.    Cardiovascular: Negative.   Gastrointestinal: Negative.   Allergic/Immunologic: Negative.   Neurological: Negative.   Psychiatric/Behavioral: Negative.         Physical Exam  BP (!) 110/59   Pulse 87   Temp (!) 97.1 F (36.2 C)   Ht 5\' 2"  (1.575 m)   Wt 182 lb (82.6 kg)   LMP 01/27/2023   SpO2 100%   BMI 33.29 kg/m   Wt Readings from Last 5 Encounters:  02/04/23 182 lb (82.6 kg)  06/12/21 173 lb (78.5 kg)  03/06/21 175 lb (79.4 kg)     Physical Exam Vitals and nursing note reviewed.  Constitutional:      General: She is not in acute distress.    Appearance: She is well-developed.  Cardiovascular:     Rate and Rhythm: Normal rate and regular rhythm.  Pulmonary:     Effort: Pulmonary effort is normal.     Breath sounds: Normal breath sounds.  Neurological:     Mental Status: She is alert and oriented to person, place, and time.       Assessment & Plan:   Chronic bilateral thoracic back pain - DG Thoracic Spine 2 View - meloxicam (MOBIC) 7.5 MG tablet; Take 1 tablet (7.5 mg total) by mouth daily.  Dispense: 30 tablet; Refill: 0  2. Chronic right shoulder pain  - DG Shoulder Right - meloxicam (MOBIC)  7.5 MG tablet; Take 1 tablet (7.5 mg total) by mouth daily.  Dispense: 30 tablet; Refill: 0  3. Lipid screening  - Lipid Panel  4. Routine adult health maintenance  - CBC - Comprehensive metabolic panel  5. Vitamin D deficiency  - Vitamin D, 25-hydroxy  6. Itching  - hydrOXYzine (ATARAX) 10 MG tablet; Take 1 tablet (10 mg total) by mouth 3 (three) times daily as needed.  Dispense: 30 tablet; Refill: 0 - Ambulatory referral to Dermatology  Follow up:  Follow up in 3 months     Ivonne Andrew, NP 02/04/2023

## 2023-02-04 NOTE — Assessment & Plan Note (Signed)
-   DG Thoracic Spine 2 View - meloxicam (MOBIC) 7.5 MG tablet; Take 1 tablet (7.5 mg total) by mouth daily.  Dispense: 30 tablet; Refill: 0  2. Chronic right shoulder pain  - DG Shoulder Right - meloxicam (MOBIC) 7.5 MG tablet; Take 1 tablet (7.5 mg total) by mouth daily.  Dispense: 30 tablet; Refill: 0  3. Lipid screening  - Lipid Panel  4. Routine adult health maintenance  - CBC - Comprehensive metabolic panel  5. Vitamin D deficiency  - Vitamin D, 25-hydroxy  6. Itching  - hydrOXYzine (ATARAX) 10 MG tablet; Take 1 tablet (10 mg total) by mouth 3 (three) times daily as needed.  Dispense: 30 tablet; Refill: 0 - Ambulatory referral to Dermatology  Follow up:  Follow up in 3 months

## 2023-02-05 LAB — COMPREHENSIVE METABOLIC PANEL
ALT: 6 IU/L (ref 0–32)
AST: 13 IU/L (ref 0–40)
Albumin: 4.3 g/dL (ref 4.0–5.0)
Alkaline Phosphatase: 70 IU/L (ref 44–121)
BUN/Creatinine Ratio: 8 — ABNORMAL LOW (ref 9–23)
BUN: 9 mg/dL (ref 6–20)
Bilirubin Total: 0.6 mg/dL (ref 0.0–1.2)
CO2: 24 mmol/L (ref 20–29)
Calcium: 9.5 mg/dL (ref 8.7–10.2)
Chloride: 105 mmol/L (ref 96–106)
Creatinine, Ser: 1.19 mg/dL — ABNORMAL HIGH (ref 0.57–1.00)
Globulin, Total: 2.7 g/dL (ref 1.5–4.5)
Glucose: 92 mg/dL (ref 70–99)
Potassium: 4.1 mmol/L (ref 3.5–5.2)
Sodium: 140 mmol/L (ref 134–144)
Total Protein: 7 g/dL (ref 6.0–8.5)
eGFR: 67 mL/min/{1.73_m2} (ref >59)

## 2023-02-05 LAB — VITAMIN D 25 HYDROXY (VIT D DEFICIENCY, FRACTURES): Vit D, 25-Hydroxy: 24.9 ng/mL — ABNORMAL LOW (ref 30.0–100.0)

## 2023-02-05 LAB — CBC
Hematocrit: 38.2 % (ref 34.0–46.6)
Hemoglobin: 12.8 g/dL (ref 11.1–15.9)
MCH: 32.5 pg (ref 26.6–33.0)
MCHC: 33.5 g/dL (ref 31.5–35.7)
MCV: 97 fL (ref 79–97)
Platelets: 232 10*3/uL (ref 150–450)
RBC: 3.94 x10E6/uL (ref 3.77–5.28)
RDW: 11.7 % (ref 11.7–15.4)
WBC: 3.7 10*3/uL (ref 3.4–10.8)

## 2023-02-05 LAB — LIPID PANEL
Chol/HDL Ratio: 2.8 ratio (ref 0.0–4.4)
Cholesterol, Total: 136 mg/dL (ref 100–199)
HDL: 48 mg/dL (ref >39)
LDL Chol Calc (NIH): 73 mg/dL (ref 0–99)
Triglycerides: 76 mg/dL (ref 0–149)
VLDL Cholesterol Cal: 15 mg/dL (ref 5–40)

## 2023-03-04 ENCOUNTER — Encounter: Payer: Self-pay | Admitting: Orthopedic Surgery

## 2023-03-04 ENCOUNTER — Ambulatory Visit (INDEPENDENT_AMBULATORY_CARE_PROVIDER_SITE_OTHER): Payer: BC Managed Care – PPO | Admitting: Orthopedic Surgery

## 2023-03-04 VITALS — BP 115/74 | HR 105 | Ht 62.0 in | Wt 182.0 lb

## 2023-03-04 DIAGNOSIS — G8929 Other chronic pain: Secondary | ICD-10-CM

## 2023-03-04 DIAGNOSIS — M546 Pain in thoracic spine: Secondary | ICD-10-CM

## 2023-03-04 NOTE — Progress Notes (Signed)
Orthopedic Spine Surgery Office Note  Assessment: Patient is a 22 y.o. female with periodic lower thoracic back pain   Plan: -She is not currently having any pain so I did not recommend any treatment at this time.  I told her that if she develops pain she can use Tylenol 1000 mg up to 3 times per day.  If pain is bad even with the Tylenol, she can use ibuprofen in addition to the Tylenol. -I instructed her to call the office if pain returns too at which point I can put in a referral for physical therapy -Gave her a packet with core strengthening exercises to work on -Patient should return to office on an as-needed basis   Patient expressed understanding of the plan and all questions were answered to the patient's satisfaction.   ___________________________________________________________________________   History:  Patient is a 22 y.o. female who presents today for thoracic spine.  Patient has had 2 to 3 years of lower thoracic back pain.  She says she feels it periodically and sporadically.  She says it would last for a day or 2 and then go away.  It may return in a couple months time.  There is no pain that radiates into either lower extremity.  She said she has not tried any treatment but with time the pain usually goes away.  There is no trauma or injury that preceded the onset of pain.  She pointed to the area of T7-T10 on her back when asked where the pain occurs when its present.   Weakness: Denies Symptoms of imbalance: Denies Paresthesias and numbness: Denies Bowel or bladder incontinence: Denies Saddle anesthesia: Denies  Treatments tried: None  Review of systems: Denies fevers and chills, night sweats, unexplained weight loss, history of cancer, pain that wakes her at night  Past medical history: Anxiety Migraines  Allergies: NKDA  Past surgical history:  None  Social history: Denies use of nicotine product (smoking, vaping, patches, smokeless) Alcohol use: Yes,  approximately 2 drinks per week Denies recreational drug use   Physical Exam:  BMI of 33.3  General: no acute distress, appears stated age Neurologic: alert, answering questions appropriately, following commands Respiratory: unlabored breathing on room air, symmetric chest rise Psychiatric: appropriate affect, normal cadence to speech   MSK (spine):  -Strength exam      Left  Right EHL    5/5  5/5 TA    5/5  5/5 GSC    5/5  5/5 Knee extension  5/5  5/5 Hip flexion   5/5  5/5  -Sensory exam    Sensation intact to light touch in L3-S1 nerve distributions of bilateral lower extremities  -Achilles DTR: 2/4 on the left, 2/4 on the right -Patellar tendon DTR: 2/4 on the left, 2/4 on the right  -Straight leg raise: Negative bilaterally -Femoral nerve stretch test: Negative bilaterally -Clonus: no beats bilaterally  -Left hip exam: No pain through range of motion, negative Stinchfield, negative FABER -Right hip exam: No pain through range of motion, negative Stinchfield, negative FABER  Imaging: XR of the thoracic spine from 02/04/2023 was independently reviewed and interpreted, showing no fracture or dislocation.  No significant degenerative changes.  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   Patient name: Maureen Atkinson Patient MRN: [id number] Date of visit: 03/04/23

## 2023-03-15 ENCOUNTER — Encounter: Payer: Self-pay | Admitting: Dermatology

## 2023-03-15 ENCOUNTER — Ambulatory Visit: Payer: BC Managed Care – PPO | Admitting: Dermatology

## 2023-03-15 VITALS — BP 127/83 | HR 68

## 2023-03-15 DIAGNOSIS — L404 Guttate psoriasis: Secondary | ICD-10-CM | POA: Diagnosis not present

## 2023-03-15 DIAGNOSIS — L299 Pruritus, unspecified: Secondary | ICD-10-CM

## 2023-03-15 DIAGNOSIS — Z0000 Encounter for general adult medical examination without abnormal findings: Secondary | ICD-10-CM

## 2023-03-15 DIAGNOSIS — R21 Rash and other nonspecific skin eruption: Secondary | ICD-10-CM | POA: Diagnosis not present

## 2023-03-15 NOTE — Progress Notes (Signed)
   New Patient Visit   Subjective  Maureen Atkinson is a 22 y.o. female who presents for the following: Itching. Dur: years. Worse in winter. States itching is internal. No rashes in the past, itches from head to toe. Sweating, getting hot makes it worse.  Now getting rashes on scattered body, since returning from a cruise ~3 weeks ago. PCP prescribed hydroxyzine 10 mg tablet to take 3 times daily as needed. Took 1 tablet, no help so took a 2nd tablet, no help so took a 3rd tablet. Caused drowsiness but was able to sleep without itching. Has been taking 3 tablets at bedtime. Worked for a few days now flared with itching this weekend.   The patient has spots, moles and lesions to be evaluated, some may be new or changing and the patient may have concern these could be cancer.   The following portions of the chart were reviewed this encounter and updated as appropriate: medications, allergies, medical history  Review of Systems:  No other skin or systemic complaints except as noted in HPI or Assessment and Plan.  Objective  Well appearing patient in no apparent distress; mood and affect are within normal limits.  A focused examination was performed of the following areas: Face, back, arms, knees  Relevant exam findings are noted in the Assessment and Plan.  Right lateral thigh Diffuse scattered pink scaly guttate thin plaques upper ext trunk post scalp, extensor knees, lower legs, right cheek  02/04/23 CBC normal, CMP elevated Cr 1.19, normal liver enzymes and Tbili                       Assessment & Plan     Rash Right lateral thigh  Skin / nail biopsy - Right lateral thigh Type of biopsy: punch   Informed consent: discussed and consent obtained   Patient was prepped and draped in usual sterile fashion: Area prepped with alcohol. 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  I, Lawson Radar, CMA, am acting as scribe for Elie Goody, MD.   Documentation: I have reviewed the above documentation for accuracy and completeness, and I agree with the above.  Elie Goody, MD

## 2023-03-15 NOTE — Patient Instructions (Signed)
Wound Care Instructions  Cleanse wound gently with soap and water once a day then pat dry with clean gauze. Apply a thin coat of Petrolatum (petroleum jelly, "Vaseline") over the wound (unless you have an allergy to this). We recommend that you use a new, sterile tube of Vaseline. Do not pick or remove scabs. Do not remove the yellow or white "healing tissue" from the base of the wound.  Cover the wound with fresh, clean, nonstick gauze and secure with paper tape. You may use Band-Aids in place of gauze and tape if the wound is small enough, but would recommend trimming much of the tape off as there is often too much. Sometimes Band-Aids can irritate the skin.  You should call the office for your biopsy report after 1 week if you have not already been contacted.  If you experience any problems, such as abnormal amounts of bleeding, swelling, significant bruising, significant pain, or evidence of infection, please call the office immediately.  FOR ADULT SURGERY PATIENTS: If you need something for pain relief you may take 1 extra strength Tylenol (acetaminophen) AND 2 Ibuprofen (200mg  each) together every 4 hours as needed for pain. (do not take these if you are allergic to them or if you have a reason you should not take them.) Typically, you may only need pain medication for 1 to 3 days.      Gentle Skin Care Guide  1. Bathe no more than once a day.  2. Avoid bathing in hot water  3. Use a mild soap like Dove, Vanicream, Cetaphil, CeraVe. Can use Lever 2000 or Cetaphil antibacterial soap  4. Use soap only where you need it. On most days, use it under your arms, between your legs, and on your feet. Let the water rinse other areas unless visibly dirty.  5. When you get out of the bath/shower, use a towel to gently blot your skin dry, don't rub it.  6. While your skin is still a little damp, apply a moisturizing cream such as Vanicream, CeraVe, Cetaphil, Eucerin, Sarna lotion or plain  Vaseline Jelly. For hands apply Neutrogena [place] Hand Cream or Excipial Hand Cream.  7. Reapply moisturizer any time you start to itch or feel dry.  8. Sometimes using free and clear laundry detergents can be helpful. Fabric softener sheets should be avoided. Downy Free & Gentle liquid, or any liquid fabric softener that is free of dyes and perfumes, it acceptable to use  9. If your doctor has given you prescription creams you may apply moisturizers over them     Due to recent changes in healthcare laws, you may see results of your pathology and/or laboratory studies on MyChart before the doctors have had a chance to review them. We understand that in some cases there may be results that are confusing or concerning to you. Please understand that not all results are received at the same time and often the doctors may need to interpret multiple results in order to provide you with the best plan of care or course of treatment. Therefore, we ask that you please give [place] 2 business days to thoroughly review all your results before contacting the office for clarification. Should we see a critical lab result, you will be contacted sooner.   If You Need Anything After Your Visit  If you have any questions or concerns for your doctor, please call our main line at [phone] and press option 4 to reach your doctor's medical assistant. If no one  answers, please leave a voicemail as directed and we will return your call as soon as possible. Messages left after 4 pm will be answered the following business day.   You may also send [place] a message via MyChart. We typically respond to MyChart messages within 1-2 business days.  For prescription refills, please ask your pharmacy to contact our office. Our fax number is [phone].  If you have an urgent issue when the clinic is closed that cannot wait until the next business day, you can page your doctor at the number below.    Please note that while we do  our best to be available for urgent issues outside of office hours, we are not available 24/7.   If you have an urgent issue and are unable to reach [place], you may choose to seek medical care at your doctor's office, retail clinic, urgent care center, or emergency room.  If you have a medical emergency, please immediately call 911 or go to the emergency department.  Pager Numbers  - Dr. Gwen Pounds: [phone]  - Dr. Roseanne Reno: [phone]  - Dr. Katrinka Blazing: [phone]   In the event of inclement weather, please call our main line at [phone] for an update on the status of any delays or closures.  Dermatology Medication Tips: Please keep the boxes that topical medications come in in order to help keep track of the instructions about where and how to use these. Pharmacies typically print the medication instructions only on the boxes and not directly on the medication tubes.   If your medication is too expensive, please contact our office at [phone] option 4 or send [place] a message through MyChart.   We are unable to tell what your co-pay for medications will be in advance as this is different depending on your insurance coverage. However, we may be able to find a substitute medication at lower cost or fill out paperwork to get insurance to cover a needed medication.   If a prior authorization is required to get your medication covered by your insurance company, please allow [place] 1-2 business days to complete this process.  Drug prices often vary depending on where the prescription is filled and some pharmacies may offer cheaper prices.  The website www.goodrx.com contains coupons for medications through different pharmacies. The prices here do not account for what the cost may be with help from insurance (it may be cheaper with your insurance), but the website can give you the price if you did not use any insurance.  - You can print the associated coupon and take it with your prescription to  the pharmacy.  - You may also stop by our office during regular business hours and pick up a GoodRx coupon card.  - If you need your prescription sent electronically to a different pharmacy, notify our office through [facility] or by phone at [phone] option 4.     Si Usted Necesita Algo Despus de Su Visita  Tambin puede enviarnos un mensaje a travs de Clinical cytogeneticist. Por lo general respondemos a los mensajes de MyChart en el transcurso de 1 a 2 das hbiles.  Para renovar recetas, por favor pida a su farmacia que se ponga en contacto con nuestra oficina. Annie Sable de fax es [place] [phone].  Si tiene un asunto urgente cuando la clnica est cerrada y que no puede esperar hasta el siguiente da hbil, puede llamar/localizar a su doctor(a) al nmero que aparece a continuacin.   Por favor, tenga en cuenta  que aunque hacemos todo lo posible para estar disponibles para asuntos urgentes fuera del horario de [place], no estamos disponibles las 24 horas del da, los 7 [address]  [address] de la [place].   Si tiene un problema urgente y no puede comunicarse con nosotros, puede optar por buscar atencin mdica  en el consultorio de su doctor(a), en una clnica privada, en un centro de atencin urgente o en una sala de emergencias.  Si tiene Engineer, drilling, por favor llame inmediatamente al 911 o vaya a la sala de emergencias.  Nmeros de bper  - Dr. Gwen Pounds: [phone]  - Dra. Roseanne Reno: [phone]  - Dr. Katrinka Blazing: [phone]   En caso de inclemencias del tiempo, por favor llame a Lacy Duverney principal al [phone] para una actualizacin sobre el [place] de cualquier retraso o cierre.  Consejos para la medicacin en dermatologa: Por favor, guarde las cajas en las que vienen los medicamentos de uso tpico para ayudarle a seguir las instrucciones sobre dnde y cmo usarlos. Las farmacias generalmente imprimen las instrucciones del medicamento slo en las cajas y no directamente en  los tubos del [place].   Si su medicamento es muy caro, por favor, pngase en contacto con Rolm Gala llamando al [phone] y presione la opcin 4 o envenos un mensaje a travs de Clinical cytogeneticist.   No podemos decirle cul ser su copago por los medicamentos por adelantado ya que esto es diferente dependiendo de la cobertura de su seguro. Sin embargo, es posible que podamos encontrar un medicamento sustituto a Audiological scientist un formulario para que el seguro cubra el medicamento que se considera necesario.   Si se requiere una autorizacin previa para que su compaa de seguros [place] su medicamento, por favor permtanos de 1 a 2 das hbiles para completar [address]  Los precios de los medicamentos varan con frecuencia dependiendo del Environmental consultant de dnde se surte la receta y alguna farmacias pueden ofrecer precios ms baratos.  El sitio web www.goodrx.com tiene cupones para medicamentos de Health and safety inspector. Los precios aqu no tienen en cuenta lo que podra costar con la ayuda del seguro (puede ser ms barato con su seguro), pero el sitio web puede darle el precio si no utiliz Tourist information centre manager.  - Puede imprimir el cupn correspondiente y llevarlo con su receta a la farmacia.  - Tambin puede pasar por nuestra oficina durante el horario de atencin regular y Education officer, museum una tarjeta de cupones de GoodRx.  - Si necesita que su receta se enve electrnicamente a una farmacia diferente, informe a nuestra oficina a travs de MyChart de [place] o por telfono llamando al [phone] y presione la opcin 4.

## 2023-03-16 LAB — TSH: TSH: 0.899 u[IU]/mL (ref 0.450–4.500)

## 2023-03-16 LAB — RPR: RPR Ser Ql: NONREACTIVE

## 2023-03-16 LAB — T4, FREE: Free T4: 1.18 ng/dL (ref 0.82–1.77)

## 2023-03-16 LAB — HIV ANTIBODY (ROUTINE TESTING W REFLEX): HIV Screen 4th Generation wRfx: NONREACTIVE

## 2023-03-18 LAB — SURGICAL PATHOLOGY

## 2023-03-23 ENCOUNTER — Encounter: Payer: Self-pay | Admitting: Dermatology

## 2023-03-23 ENCOUNTER — Ambulatory Visit (INDEPENDENT_AMBULATORY_CARE_PROVIDER_SITE_OTHER): Payer: BC Managed Care – PPO | Admitting: Dermatology

## 2023-03-23 DIAGNOSIS — Z4802 Encounter for removal of sutures: Secondary | ICD-10-CM

## 2023-03-23 NOTE — Progress Notes (Signed)
   Follow-Up Visit   Subjective  Maureen Atkinson is a 22 y.o. female who presents for the following: Suture removal, discuss pathology results and treatment options. Patient states she had strep throat in August 2024.   Pathology showed guttate psoriasis.  The following portions of the chart were reviewed this encounter and updated as appropriate: medications, allergies, medical history  Review of Systems:  No other skin or systemic complaints except as noted in HPI or Assessment and Plan.  Objective  Well appearing patient in no apparent distress; mood and affect are within normal limits.  Areas Examined: Face, legs  Relevant physical exam findings are noted in the Assessment and Plan.    Assessment & Plan    Encounter for Removal of Sutures - Incision site is clean, dry and intact. - Wound cleansed, sutures removed, wound cleansed and steri strips applied.  - Discussed pathology results showing guttate psoriasis - Patient advised to keep steri-strips dry until they fall off. - Scars remodel for a full year. - Once steri-strips fall off, patient can apply over-the-counter silicone scar cream once to twice a day to help with scar remodeling if desired. - Patient advised to call with any concerns or if they notice any new or changing lesions.  Discussed diagnosis of guttate psoriasis: patient had preceding strep infection in August that was treated at home with antibiotics. We can treat it with light therapy three times per week and/or hydrocortisone 2.5% cream on all the affected areas. It is ok to use on the face, but stop applying it when the skin feels smooth.  Patient deferred treatment at this time. Will likely self-resolve   Return if symptoms worsen or fail to improve.  I, Lawson Radar, CMA, am acting as scribe for Elie Goody, MD.   Documentation: I have reviewed the above documentation for accuracy and completeness, and I agree with the  above.  Elie Goody, MD

## 2023-03-23 NOTE — Patient Instructions (Signed)

## 2023-04-19 ENCOUNTER — Ambulatory Visit: Payer: Self-pay | Admitting: Nurse Practitioner

## 2023-04-25 IMAGING — US US OB < 14 WEEKS - US OB TV
1 series · 15 of 28 positions shown · non-contrast
Comparison: None.

CLINICAL DATA: Vaginal bleeding

EXAM:
OBSTETRIC <14 WK US AND TRANSVAGINAL OB US
TECHNIQUE: Both transabdominal and transvaginal ultrasound examinations were
performed for complete evaluation of the gestation as well as the
maternal uterus, adnexal regions, and pelvic cul-de-sac.
Transvaginal technique was performed to assess early pregnancy.

[Series 1: us ob < 14 weeks - us ob tv · 54 acquisitions, 15 frames shown]
[im 1/54]
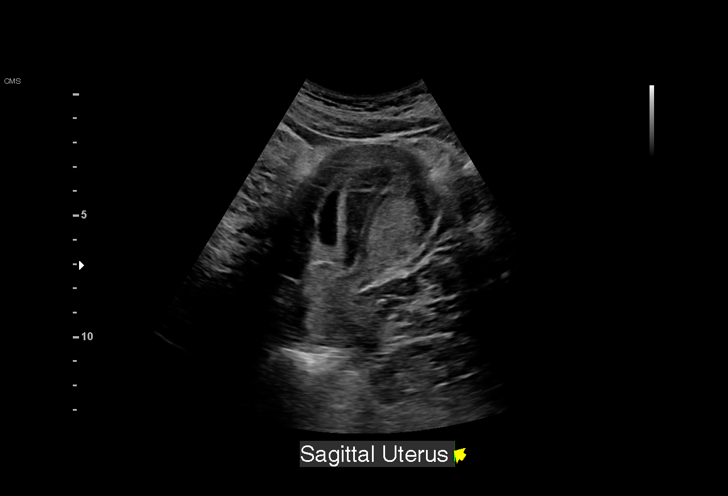
[im 4/54]
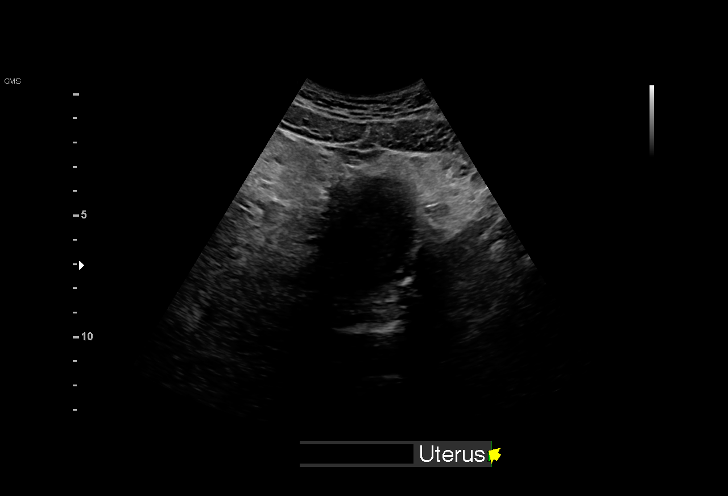
[im 8/54]
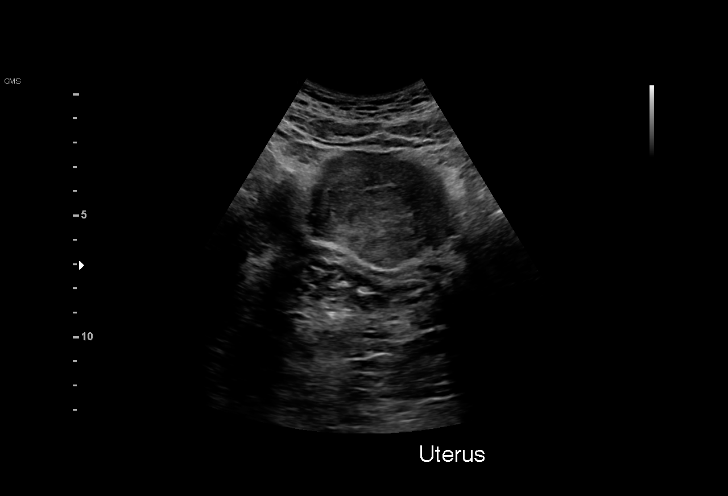
[im 12/54]
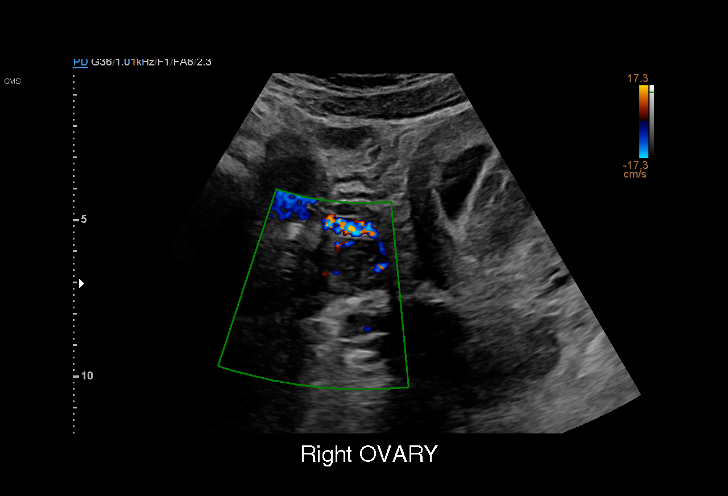
[im 16/54]
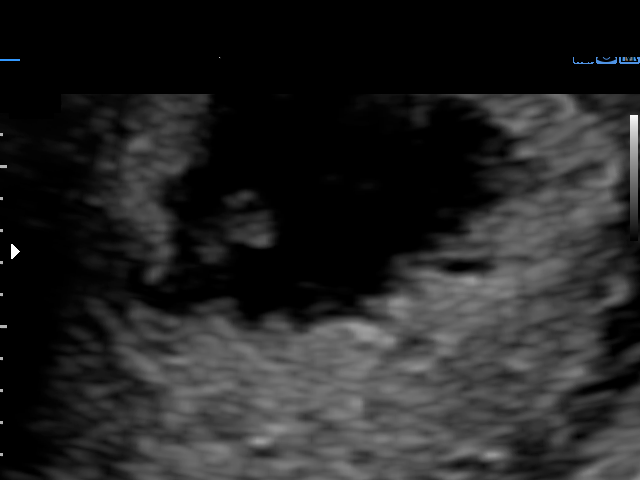
[im 20/54]
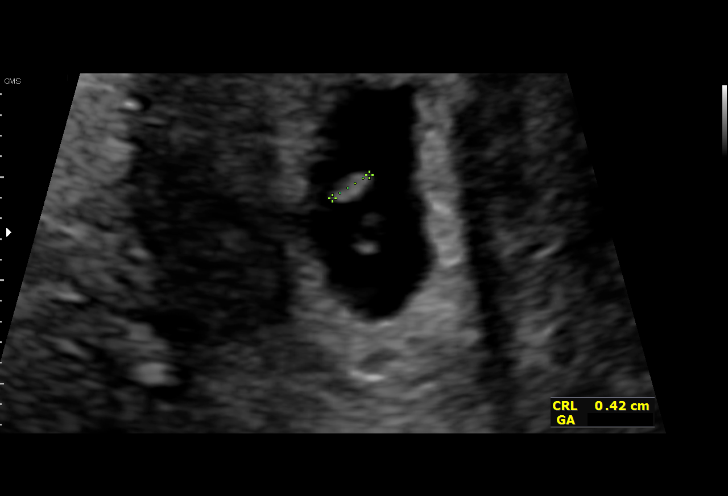
[im 24/54]
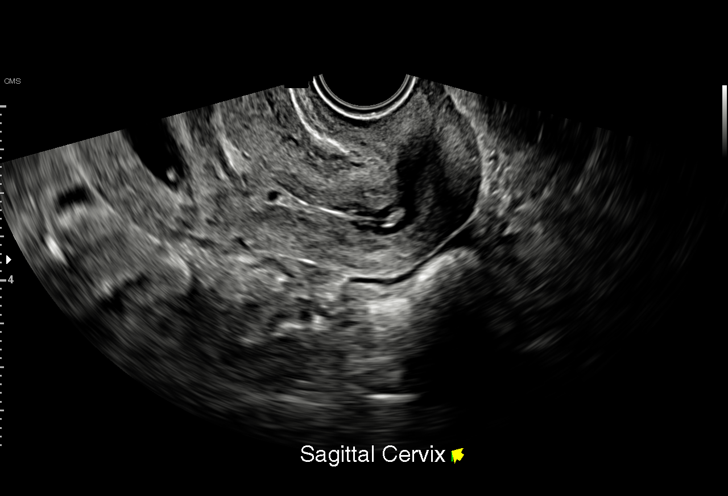
[im 28/54]
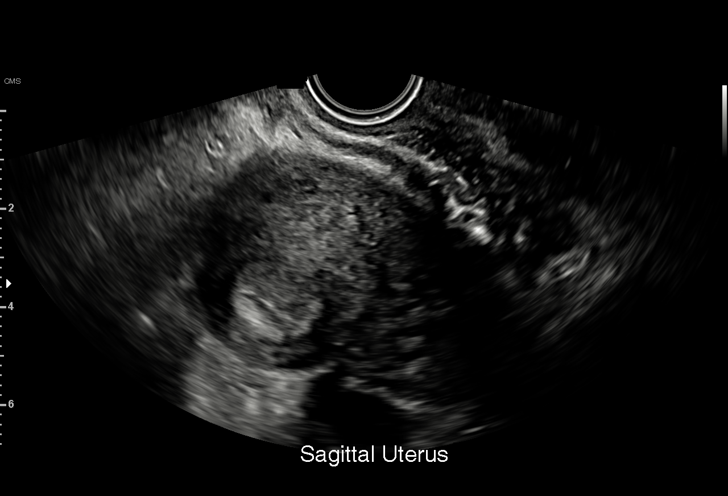
[im 30/54]
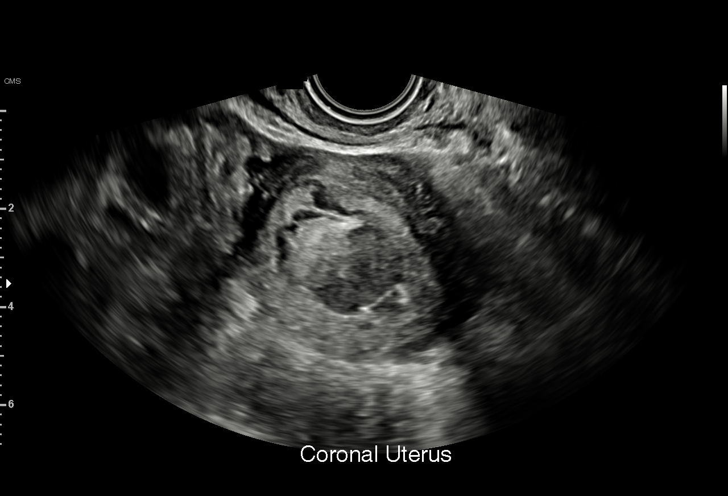
[im 34/54]
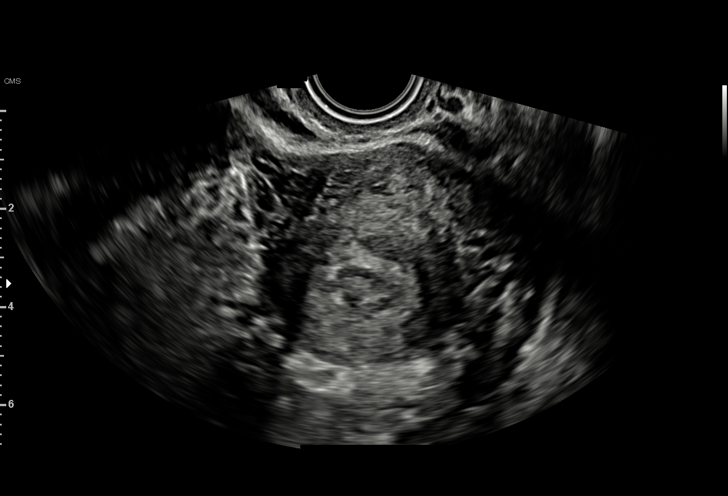
[im 38/54]
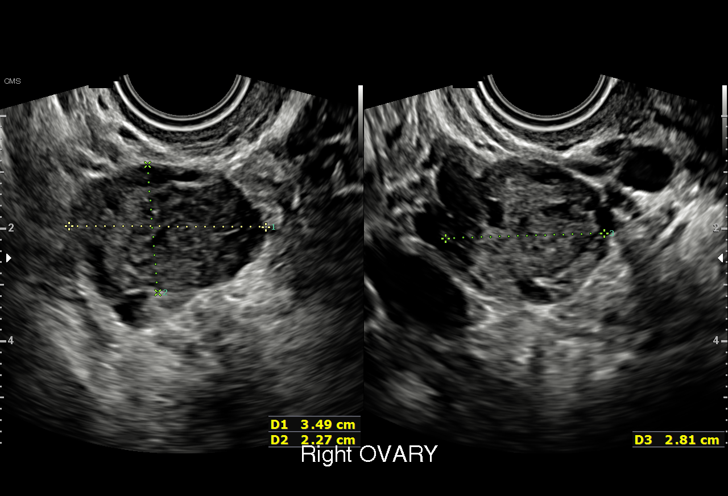
[im 42/54]
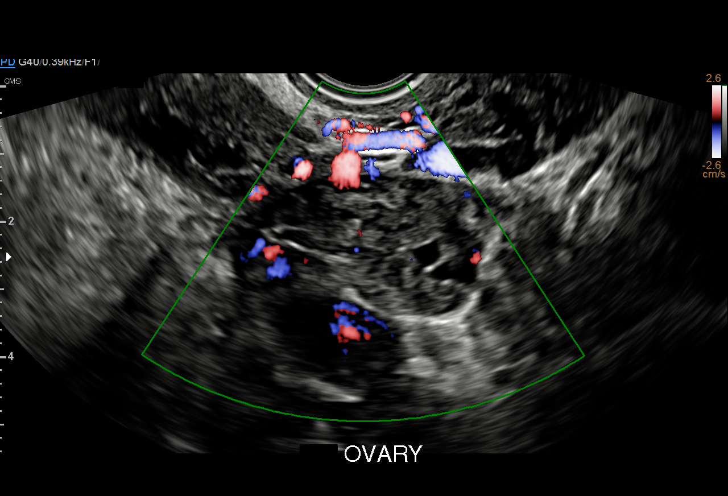
[im 46/54]
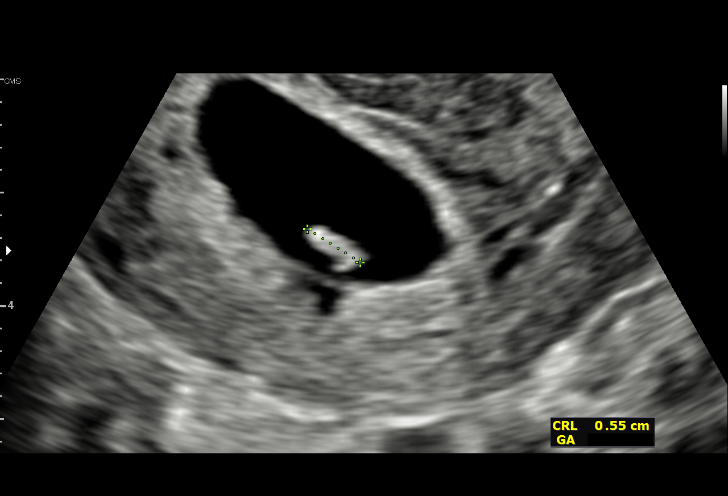
[im 50/54]
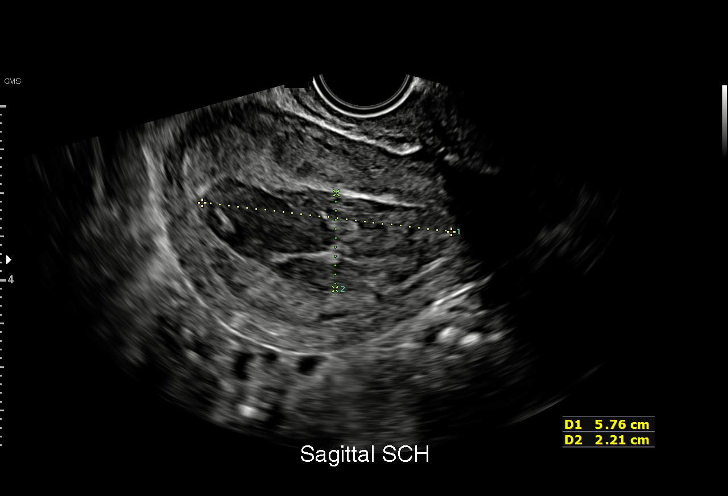
[im 54/54]
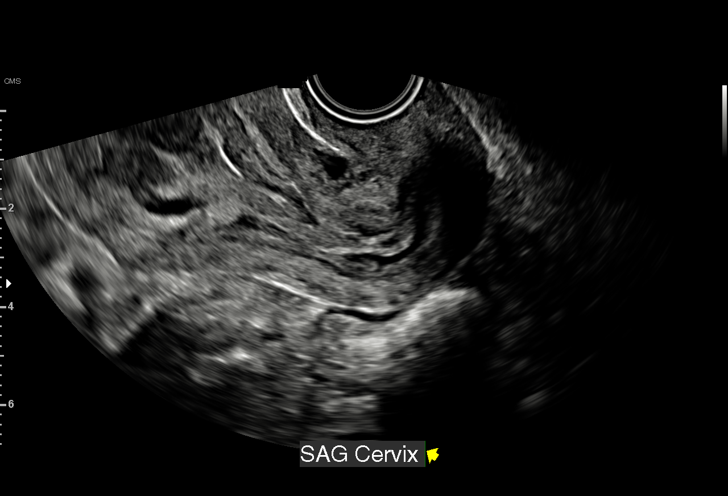

[15 of 28 positions shown; findings below may reference images not displayed]

FINDINGS: Intrauterine gestational sac: Single

Yolk sac:  Visualized.

Embryo:  Visualized.

Cardiac Activity: Visualized.

Heart Rate: 124 bpm

CRL:  5.6 mm   6 w   2 d                  US EDC: 02/03/2022

Subchorionic hemorrhage: Large subchorionic hemorrhage measuring
x 2.2 x 1.4 cm.

Maternal uterus/adnexae: Normal appearance of the bilateral ovaries
IMPRESSION: 1. Single viable intrauterine pregnancy with an estimated
gestational age of 6 weeks 2 days based on crown-rump length and
ultrasound E CT of 02/03/2022.
[DATE]. Large subchorionic hemorrhage.

## 2023-08-23 ENCOUNTER — Ambulatory Visit (INDEPENDENT_AMBULATORY_CARE_PROVIDER_SITE_OTHER): Admitting: Nurse Practitioner

## 2023-08-23 ENCOUNTER — Encounter: Payer: Self-pay | Admitting: Nurse Practitioner

## 2023-08-23 ENCOUNTER — Ambulatory Visit: Attending: Nurse Practitioner

## 2023-08-23 VITALS — BP 129/68 | HR 91 | Temp 98.5°F | Wt 187.0 lb

## 2023-08-23 DIAGNOSIS — R002 Palpitations: Secondary | ICD-10-CM | POA: Diagnosis not present

## 2023-08-23 DIAGNOSIS — L299 Pruritus, unspecified: Secondary | ICD-10-CM | POA: Diagnosis not present

## 2023-08-23 DIAGNOSIS — E559 Vitamin D deficiency, unspecified: Secondary | ICD-10-CM | POA: Diagnosis not present

## 2023-08-23 NOTE — Progress Notes (Signed)
   Subjective   Patient ID: Maureen Atkinson, female    DOB: 2000/12/06, 23 y.o.   MRN: [id number]  Chief Complaint  Patient presents with   Annual Exam    Patient stated that she is still itching and the mediation isn't working anymore.     Referring provider: Ivonne Andrew, NP  Maureen Atkinson is a 23 y.o. female with Past Medical History: No date: Medical history non-contributory  HPI  Patient presents today for a routine follow-up.  She has been seen by dermatology for itching.  She states that she itches all over especially at night.  She has been taking hydroxyzine but states that it was working well at first and now it does not work as well.  We discussed that we will send her for referral to allergy specialist.  Benadryl has not been effective. Has seen dermatology. Will place referral to allergy specialist. Denies f/c/s, n/v/d, hemoptysis, PND, leg swelling Denies chest pain or edema    No Known Allergies   There is no immunization history on file for this patient.  Tobacco History: Social History   Tobacco Use  Smoking Status Former   Types: Cigarettes  Smokeless Tobacco Never   Counseling given: Not Answered   Outpatient Encounter Medications as of 08/23/2023  Medication Sig   hydrOXYzine (ATARAX) 10 MG tablet Take 1 tablet (10 mg total) by mouth 3 (three) times daily as needed.   meloxicam (MOBIC) 7.5 MG tablet Take 1 tablet (7.5 mg total) by mouth daily.   No facility-administered encounter medications on file as of 08/23/2023.    Review of Systems  Review of Systems  Constitutional: Negative.   HENT: Negative.    Cardiovascular: Negative.   Gastrointestinal: Negative.   Allergic/Immunologic: Negative.   Neurological: Negative.   Psychiatric/Behavioral: Negative.       Objective:   BP 129/68   Pulse 91   Temp 98.5 F (36.9 C) (Oral)   Wt 187 lb (84.8 kg)   SpO2 100%   BMI 34.20 kg/m   Wt Readings from Last 5 Encounters:  08/23/23 187  lb (84.8 kg)  03/04/23 182 lb (82.6 kg)  02/04/23 182 lb (82.6 kg)  06/12/21 173 lb (78.5 kg)  03/06/21 175 lb (79.4 kg)     Physical Exam Vitals and nursing note reviewed.  Constitutional:      General: She is not in acute distress.    Appearance: She is well-developed.  Cardiovascular:     Rate and Rhythm: Normal rate and regular rhythm.  Pulmonary:     Effort: Pulmonary effort is normal.     Breath sounds: Normal breath sounds.  Neurological:     Mental Status: She is alert and oriented to person, place, and time.       Assessment & Plan:   Itching -     Ambulatory referral to Allergy -     CBC -     Comprehensive metabolic panel -     Thyroid Panel With TSH  Heart palpitations -     LONG TERM MONITOR (3-14 DAYS); Future  Vitamin D deficiency -     VITAMIN D 25 Hydroxy (Vit-D Deficiency, Fractures)     Return in about 6 months (around 02/23/2024).   Ivonne Andrew, NP 08/23/2023

## 2023-08-23 NOTE — Patient Instructions (Signed)
 1. Itching (Primary)  - Ambulatory referral to Allergy - CBC - Comprehensive metabolic panel - Thyroid Panel With TSH  2. Heart palpitations  - LONG TERM MONITOR (3-14 DAYS); Future  3. Vitamin D deficiency  - Vitamin D, 25-hydroxy

## 2023-08-23 NOTE — Progress Notes (Unsigned)
Enrolled patient for a 14 day Zio XT monitor to be mailed to patients home  EP to read

## 2023-08-24 LAB — COMPREHENSIVE METABOLIC PANEL
ALT: 11 IU/L (ref 0–32)
AST: 17 IU/L (ref 0–40)
Albumin: 4.3 g/dL (ref 4.0–5.0)
Alkaline Phosphatase: 89 IU/L (ref 44–121)
BUN/Creatinine Ratio: 15 (ref 9–23)
BUN: 11 mg/dL (ref 6–20)
Bilirubin Total: 0.4 mg/dL (ref 0.0–1.2)
CO2: 23 mmol/L (ref 20–29)
Calcium: 9.8 mg/dL (ref 8.7–10.2)
Chloride: 104 mmol/L (ref 96–106)
Creatinine, Ser: 0.73 mg/dL (ref 0.57–1.00)
Globulin, Total: 3 g/dL (ref 1.5–4.5)
Glucose: 83 mg/dL (ref 70–99)
Potassium: 4.8 mmol/L (ref 3.5–5.2)
Sodium: 138 mmol/L (ref 134–144)
Total Protein: 7.3 g/dL (ref 6.0–8.5)
eGFR: 119 mL/min/{1.73_m2} (ref >59)

## 2023-08-24 LAB — CBC
Hematocrit: 37.2 % (ref 34.0–46.6)
Hemoglobin: 12.1 g/dL (ref 11.1–15.9)
MCH: 28.7 pg (ref 26.6–33.0)
MCHC: 32.5 g/dL (ref 31.5–35.7)
MCV: 88 fL (ref 79–97)
Platelets: 327 10*3/uL (ref 150–450)
RBC: 4.21 x10E6/uL (ref 3.77–5.28)
RDW: 13.8 % (ref 11.7–15.4)
WBC: 6.5 10*3/uL (ref 3.4–10.8)

## 2023-08-24 LAB — VITAMIN D 25 HYDROXY (VIT D DEFICIENCY, FRACTURES): Vit D, 25-Hydroxy: 15.1 ng/mL — ABNORMAL LOW (ref 30.0–100.0)

## 2023-08-24 LAB — THYROID PANEL WITH TSH
Free Thyroxine Index: 2.6 (ref 1.2–4.9)
T3 Uptake Ratio: 29 % (ref 24–39)
T4, Total: 9 ug/dL (ref 4.5–12.0)
TSH: 1.07 u[IU]/mL (ref 0.450–4.500)

## 2023-08-25 ENCOUNTER — Other Ambulatory Visit: Payer: Self-pay | Admitting: Nurse Practitioner

## 2023-08-25 MED ORDER — VITAMIN D (ERGOCALCIFEROL) 1.25 MG (50000 UNIT) PO CAPS: 50000.0000 [IU] | ORAL_CAPSULE | ORAL | 2 refills | Status: AC

## 2024-02-22 ENCOUNTER — Telehealth: Payer: Self-pay | Admitting: Nurse Practitioner

## 2024-02-23 ENCOUNTER — Ambulatory Visit: Payer: Self-pay | Admitting: Nurse Practitioner

## 2024-02-23 NOTE — Telephone Encounter (Signed)
 9/9//2025 @ 1:35pm Reminder Call of appointment on 02/23/2024 @1 :40pm [facility] vm full
